# Patient Record
Sex: Male | Born: 1945 | Race: White | Hispanic: No | Marital: Married
Health system: Southern US, Community
[De-identification: ages and names within clinical notes are randomized; demographics above are authoritative.]

## PROBLEM LIST (undated history)

## (undated) DIAGNOSIS — E785 Hyperlipidemia, unspecified: Secondary | ICD-10-CM

## (undated) DIAGNOSIS — M961 Postlaminectomy syndrome, not elsewhere classified: Secondary | ICD-10-CM

## (undated) DIAGNOSIS — A4902 Methicillin resistant Staphylococcus aureus infection, unspecified site: Secondary | ICD-10-CM

## (undated) DIAGNOSIS — I10 Essential (primary) hypertension: Secondary | ICD-10-CM

## (undated) DIAGNOSIS — E119 Type 2 diabetes mellitus without complications: Secondary | ICD-10-CM

## (undated) DIAGNOSIS — G4730 Sleep apnea, unspecified: Secondary | ICD-10-CM

## (undated) HISTORY — PX: APPENDECTOMY: SHX54

## (undated) HISTORY — DX: Postlaminectomy syndrome, not elsewhere classified: M96.1

## (undated) HISTORY — DX: Hyperlipidemia, unspecified: E78.5

## (undated) HISTORY — DX: Type 2 diabetes mellitus without complications: E11.9

## (undated) HISTORY — DX: Essential (primary) hypertension: I10

## (undated) HISTORY — PX: SPINE SURGERY: SHX786

## (undated) HISTORY — PX: OTHER SURGICAL HISTORY: SHX169

## (undated) HISTORY — DX: Sleep apnea, unspecified: G47.30

## (undated) HISTORY — DX: Methicillin resistant Staphylococcus aureus infection, unspecified site: A49.02

---

## 1994-12-13 HISTORY — PX: OTHER SURGICAL HISTORY: SHX169

## 2005-02-26 ENCOUNTER — Ambulatory Visit: Payer: Self-pay | Admitting: Specialist

## 2005-03-29 ENCOUNTER — Ambulatory Visit (HOSPITAL_COMMUNITY): Admission: RE | Admit: 2005-03-29 | Discharge: 2005-03-30 | Payer: Self-pay | Admitting: Neurosurgery

## 2005-08-24 ENCOUNTER — Ambulatory Visit (HOSPITAL_COMMUNITY): Admission: RE | Admit: 2005-08-24 | Discharge: 2005-08-24 | Payer: Self-pay | Admitting: Neurosurgery

## 2005-09-07 ENCOUNTER — Ambulatory Visit (HOSPITAL_COMMUNITY): Admission: RE | Admit: 2005-09-07 | Discharge: 2005-09-07 | Payer: Self-pay | Admitting: Neurosurgery

## 2005-09-14 ENCOUNTER — Ambulatory Visit: Payer: Self-pay | Admitting: Infectious Diseases

## 2005-09-16 ENCOUNTER — Ambulatory Visit: Payer: Self-pay | Admitting: Infectious Diseases

## 2005-09-23 ENCOUNTER — Ambulatory Visit: Payer: Self-pay | Admitting: Infectious Diseases

## 2005-10-18 ENCOUNTER — Ambulatory Visit: Payer: Self-pay | Admitting: Infectious Diseases

## 2005-10-19 ENCOUNTER — Ambulatory Visit (HOSPITAL_COMMUNITY): Admission: RE | Admit: 2005-10-19 | Discharge: 2005-10-19 | Payer: Self-pay | Admitting: Infectious Diseases

## 2005-10-25 ENCOUNTER — Ambulatory Visit: Payer: Self-pay | Admitting: Infectious Diseases

## 2006-11-06 IMAGING — XA IR FLUORO GUIDE NDL PLMT / BX
1 series · 6 of 6 positions shown · non-contrast
Comparison: none

CLINICAL DATA: Diskectomy in [DATE]. MRI worrisome for diskitis at L3-4.  Transitional anatomy with a lumbarized S-1.
 L3-4 DISK ASPIRATION:
 Procedure:  The back was prepped and draped in a sterile fashion.  Lidocaine was utilized for local anesthesia.  Under fluoroscopic guidance, a single 20 gauge spinal needle was inserted into the 3-4 disk from posterolateral approach. One to 2 cc of sanguinous fluid was aspirated without complication.

[Series 1: run · 6 of 6 slices shown]
[im 1/6]
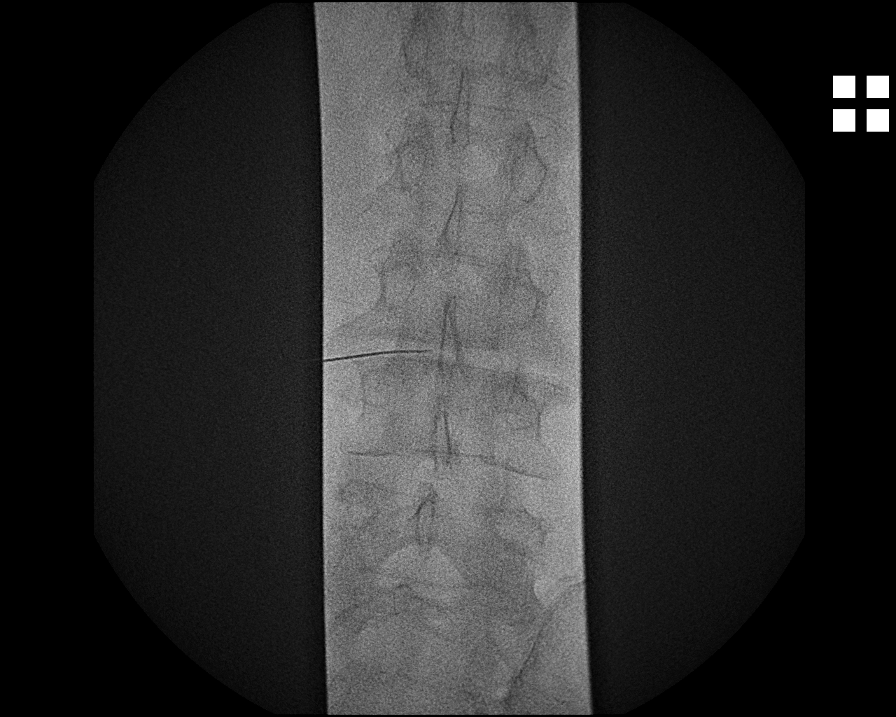
[im 2/6]
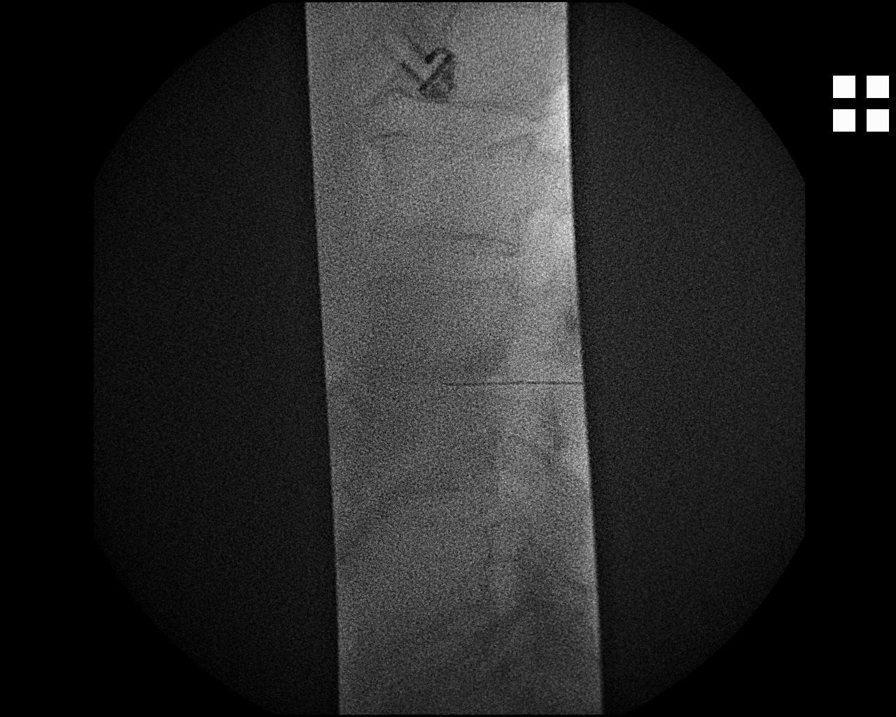
[im 3/6]
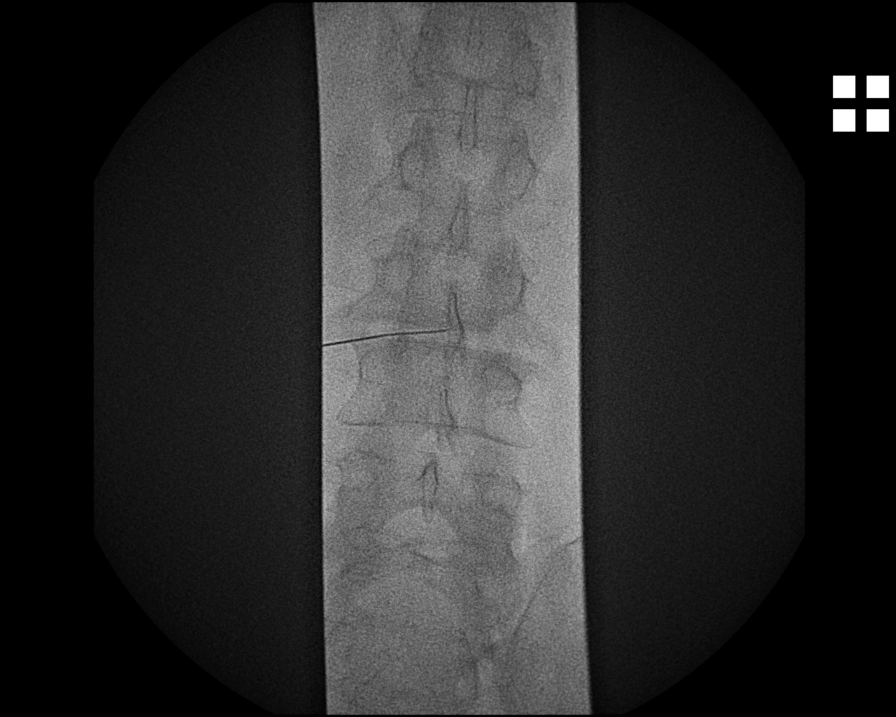
[im 4/6]
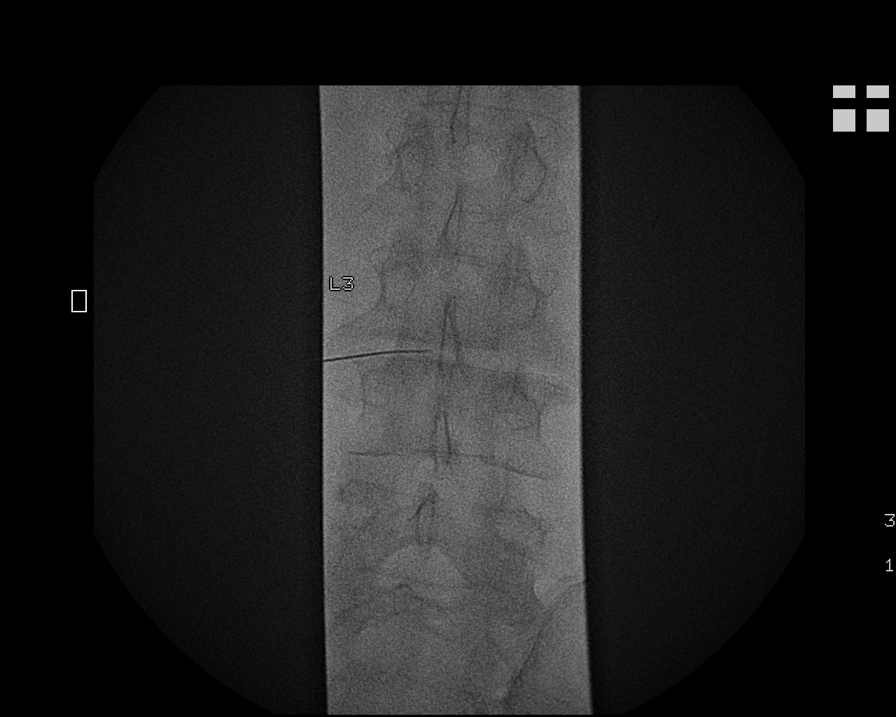
[im 5/6]
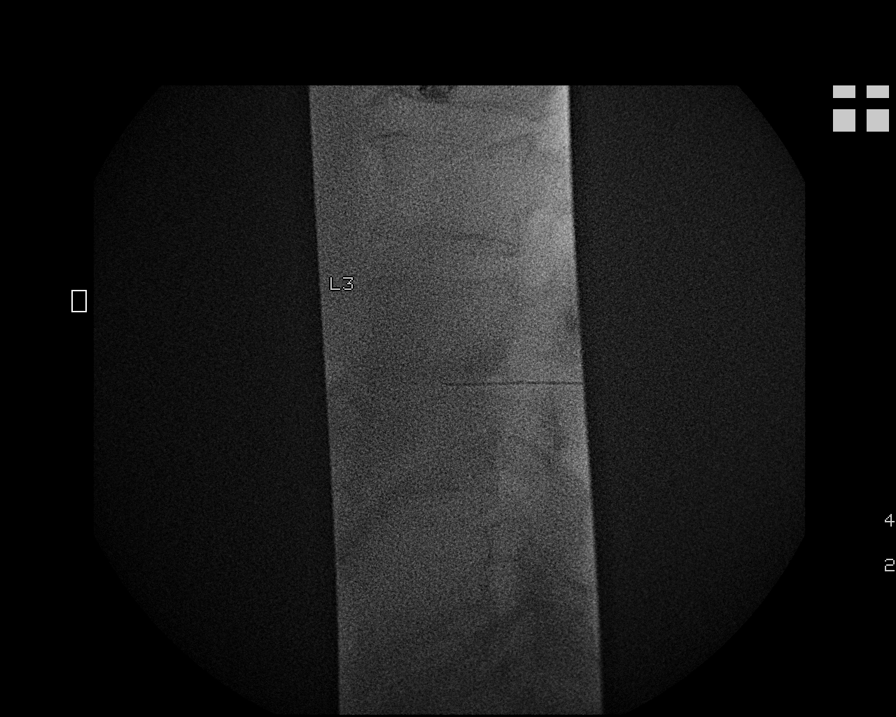
[im 6/6]
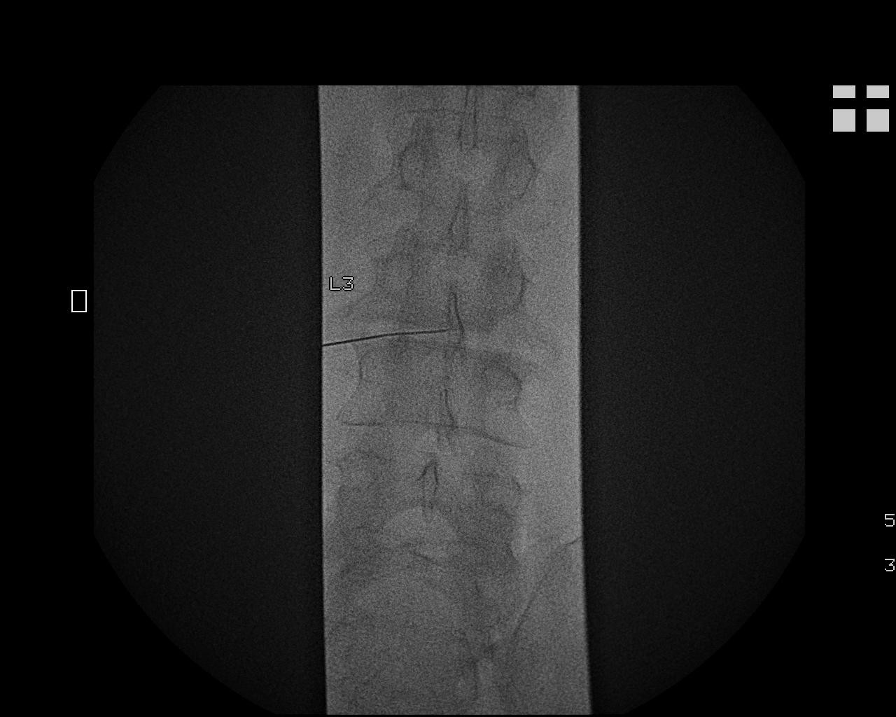

[6 of 6 positions shown; findings below may reference images not displayed]

FINDINGS: The image demonstrates needle placement in the middle of the L3-4 disk.
IMPRESSION: Successful L3-4 disk aspiration.  A sample was sent for culture sensitivity and gram stain.

## 2006-11-24 ENCOUNTER — Ambulatory Visit: Payer: Self-pay | Admitting: General Surgery

## 2008-11-28 ENCOUNTER — Ambulatory Visit: Payer: Self-pay | Admitting: Gastroenterology

## 2009-10-11 ENCOUNTER — Inpatient Hospital Stay: Payer: Self-pay | Admitting: Unknown Physician Specialty

## 2014-07-09 ENCOUNTER — Emergency Department: Payer: Self-pay | Admitting: Emergency Medicine

## 2014-07-09 LAB — CBC
HCT: 48.6 % (ref 40.0–52.0)
HGB: 16.2 g/dL (ref 13.0–18.0)
MCH: 29.4 pg (ref 26.0–34.0)
MCHC: 33.4 g/dL (ref 32.0–36.0)
MCV: 88 fL (ref 80–100)
Platelet: 177 10*3/uL (ref 150–440)
RBC: 5.52 10*6/uL (ref 4.40–5.90)
RDW: 13.3 % (ref 11.5–14.5)
WBC: 10.4 10*3/uL (ref 3.8–10.6)

## 2014-07-09 LAB — BASIC METABOLIC PANEL
ANION GAP: 12 (ref 7–16)
BUN: 17 mg/dL (ref 7–18)
CHLORIDE: 99 mmol/L (ref 98–107)
Calcium, Total: 9 mg/dL (ref 8.5–10.1)
Co2: 21 mmol/L (ref 21–32)
Creatinine: 0.96 mg/dL (ref 0.60–1.30)
EGFR (African American): >60
EGFR (Non-African Amer.): >60
Glucose: 175 mg/dL — ABNORMAL HIGH (ref 65–99)
Osmolality: 270 (ref 275–301)
Potassium: 3.7 mmol/L (ref 3.5–5.1)
Sodium: 132 mmol/L — ABNORMAL LOW (ref 136–145)

## 2014-07-09 LAB — PRO B NATRIURETIC PEPTIDE: B-Type Natriuretic Peptide: 64 pg/mL (ref 0–125)

## 2014-07-09 LAB — TROPONIN I: TROPONIN-I: 0.02 ng/mL

## 2015-06-09 ENCOUNTER — Other Ambulatory Visit: Payer: Self-pay | Admitting: Family Medicine

## 2015-06-25 ENCOUNTER — Ambulatory Visit: Payer: Self-pay | Admitting: Family Medicine

## 2015-07-29 DIAGNOSIS — I10 Essential (primary) hypertension: Secondary | ICD-10-CM | POA: Insufficient documentation

## 2015-07-29 DIAGNOSIS — E119 Type 2 diabetes mellitus without complications: Secondary | ICD-10-CM | POA: Insufficient documentation

## 2015-07-29 DIAGNOSIS — E785 Hyperlipidemia, unspecified: Secondary | ICD-10-CM | POA: Insufficient documentation

## 2015-07-30 ENCOUNTER — Telehealth: Payer: Self-pay | Admitting: Family Medicine

## 2015-07-30 ENCOUNTER — Other Ambulatory Visit: Payer: Self-pay | Admitting: Family Medicine

## 2015-07-30 ENCOUNTER — Telehealth: Payer: Self-pay

## 2015-07-30 ENCOUNTER — Encounter: Payer: Self-pay | Admitting: Family Medicine

## 2015-07-30 ENCOUNTER — Ambulatory Visit (INDEPENDENT_AMBULATORY_CARE_PROVIDER_SITE_OTHER): Payer: Federal, State, Local not specified - PPO | Admitting: Family Medicine

## 2015-07-30 VITALS — BP 136/74 | HR 78 | Temp 98.9°F | Ht 68.5 in | Wt 235.0 lb

## 2015-07-30 DIAGNOSIS — E119 Type 2 diabetes mellitus without complications: Secondary | ICD-10-CM | POA: Diagnosis not present

## 2015-07-30 DIAGNOSIS — I10 Essential (primary) hypertension: Secondary | ICD-10-CM

## 2015-07-30 DIAGNOSIS — E785 Hyperlipidemia, unspecified: Secondary | ICD-10-CM

## 2015-07-30 LAB — LP+ALT+AST PICCOLO, WAIVED
ALT (SGPT) Piccolo, Waived: 21 U/L (ref 10–47)
AST (SGOT) Piccolo, Waived: 25 U/L (ref 11–38)
Chol/HDL Ratio Piccolo,Waive: 5.9 mg/dL — ABNORMAL HIGH
Cholesterol Piccolo, Waived: 202 mg/dL — ABNORMAL HIGH (ref <200)
HDL Chol Piccolo, Waived: 34 mg/dL — ABNORMAL LOW (ref >59)
LDL CHOL CALC PICCOLO WAIVED: 114 mg/dL — AB (ref <100)
Triglycerides Piccolo,Waived: 271 mg/dL — ABNORMAL HIGH (ref <150)
VLDL Chol Calc Piccolo,Waive: 54 mg/dL — ABNORMAL HIGH (ref <30)

## 2015-07-30 LAB — MICROALBUMIN, URINE WAIVED
CREATININE, URINE WAIVED: 100 mg/dL (ref 10–300)
Microalb, Ur Waived: 150 mg/L — ABNORMAL HIGH (ref 0–19)
Microalb/Creat Ratio: >300 mg/g — ABNORMAL HIGH (ref <30)

## 2015-07-30 LAB — BAYER DCA HB A1C WAIVED: HB A1C (BAYER DCA - WAIVED): 7 % — ABNORMAL HIGH (ref <7.0)

## 2015-07-30 MED ORDER — AMLODIPINE BESYLATE 5 MG PO TABS: 5.0000 mg | ORAL_TABLET | Freq: Every day | ORAL | Status: DC

## 2015-07-30 MED ORDER — METFORMIN HCL 500 MG PO TABS: 1000.0000 mg | ORAL_TABLET | Freq: Two times a day (BID) | ORAL | Status: DC

## 2015-07-30 MED ORDER — GLIPIZIDE 5 MG PO TABS: 5.0000 mg | ORAL_TABLET | Freq: Every day | ORAL | Status: DC

## 2015-07-30 MED ORDER — BENAZEPRIL HCL 40 MG PO TABS: 40.0000 mg | ORAL_TABLET | Freq: Every day | ORAL | Status: DC

## 2015-07-30 MED ORDER — EMPAGLIFLOZIN 25 MG PO TABS: 25.0000 mg | ORAL_TABLET | Freq: Every day | ORAL | Status: DC

## 2015-07-30 MED ORDER — LIRAGLUTIDE 18 MG/3ML ~~LOC~~ SOPN: 1.8000 mg | PEN_INJECTOR | Freq: Every day | SUBCUTANEOUS | Status: DC

## 2015-07-30 MED ORDER — OXYCODONE-ACETAMINOPHEN 5-325 MG PO TABS: 1.0000 | ORAL_TABLET | Freq: Two times a day (BID) | ORAL | Status: DC | PRN

## 2015-07-30 MED ORDER — INSULIN PEN NEEDLE 32G X 6 MM MISC: 1.0000 | Freq: Every day | Status: DC

## 2015-07-30 MED ORDER — SIMVASTATIN 40 MG PO TABS: 40.0000 mg | ORAL_TABLET | Freq: Every day | ORAL | Status: DC

## 2015-07-30 MED ORDER — OXYCODONE-ACETAMINOPHEN 5-325 MG PO TABS: 1.0000 | ORAL_TABLET | Freq: Every day | ORAL | Status: DC | PRN

## 2015-07-30 NOTE — Telephone Encounter (Signed)
Percocet historically written for BID, she would like a new Rx

## 2015-07-30 NOTE — Progress Notes (Signed)
   BP 136/74 mmHg  Pulse 78  Temp(Src) 98.9 F (37.2 C)  Ht 5' 8.5" (1.74 m)  Wt 235 lb (106.595 kg)  BMI 35.21 kg/m2  SpO2 96%   Subjective:    Patient ID: Edward Preston, male    DOB: 09-11-1946, 69 y.o.   MRN: [id number]  HPI: JORDIE SCHREUR is a 69 y.o. male  No chief complaint on file.  patient recheck medical problems has been doing stable with diabetes hypertension. Has been trying to treat cholesterol with dietary supplements and diet. Not taking simvastatin. Back pain controlled with Percocet takes when necessary 180 tablets has lasted more than 180 days.  Relevant past medical, surgical, family and social history reviewed and updated as indicated. Interim medical history since our last visit reviewed. Allergies and medications reviewed and updated.  Review of Systems  Constitutional: Negative.   Respiratory: Negative.   Cardiovascular: Negative.   Endocrine: Negative.     Per HPI unless specifically indicated above     Objective:    BP 136/74 mmHg  Pulse 78  Temp(Src) 98.9 F (37.2 C)  Ht 5' 8.5" (1.74 m)  Wt 235 lb (106.595 kg)  BMI 35.21 kg/m2  SpO2 96%  Wt Readings from Last 3 Encounters:  07/30/15 235 lb (106.595 kg)  03/13/15 235 lb (106.595 kg)    Physical Exam  Constitutional: He is oriented to person, place, and time. He appears well-developed and well-nourished. No distress.  HENT:  Head: Normocephalic and atraumatic.  Right Ear: Hearing normal.  Left Ear: Hearing normal.  Nose: Nose normal.  Eyes: Conjunctivae and lids are normal. Right eye exhibits no discharge. Left eye exhibits no discharge. No scleral icterus.  Cardiovascular: Normal rate, regular rhythm and normal heart sounds.   Pulmonary/Chest: Effort normal and breath sounds normal. No respiratory distress.  Musculoskeletal: Normal range of motion.  Neurological: He is alert and oriented to person, place, and time.  Skin: Skin is intact. No rash noted.  Psychiatric: He has a  normal mood and affect. His speech is normal and behavior is normal. Judgment and thought content normal. Cognition and memory are normal.        Assessment & Plan:   Problem List Items Addressed This Visit      Endocrine   Diabetes mellitus without complication   Relevant Medications   simvastatin (ZOCOR) 40 MG tablet   metFORMIN (GLUCOPHAGE) 500 MG tablet   glipiZIDE (GLUCOTROL) 5 MG tablet   empagliflozin (JARDIANCE) 25 MG TABS tablet   benazepril (LOTENSIN) 40 MG tablet   Liraglutide (VICTOZA) 18 MG/3ML SOPN   Other Relevant Orders   Bayer DCA Hb A1c Waived   Microalbumin, Urine Waived    Other Visit Diagnoses    Hyperlipemia    -  Primary    Relevant Medications    simvastatin (ZOCOR) 40 MG tablet    benazepril (LOTENSIN) 40 MG tablet    amLODipine (NORVASC) 5 MG tablet    Other Relevant Orders    LP+ALT+AST Piccolo, Waived    Essential hypertension, benign        Relevant Medications    simvastatin (ZOCOR) 40 MG tablet    benazepril (LOTENSIN) 40 MG tablet    amLODipine (NORVASC) 5 MG tablet    Other Relevant Orders    Basic metabolic panel        Follow up plan: Return in about 3 months (around 10/30/2015) for Diabetes recheck.

## 2015-07-30 NOTE — Telephone Encounter (Signed)
PTS WIFE WENT [place] AND THEY TOLD HER THERE WAS SOMETHING WRONG WITH THE WAY THE RX WAS WROTE AND SHE WOULD LIKE TO SPEAK TO SOMEONE ABOUT IT TO GET THE ENTIRE RX FILLED.

## 2015-07-31 ENCOUNTER — Encounter: Payer: Self-pay | Admitting: Family Medicine

## 2015-07-31 LAB — BASIC METABOLIC PANEL
BUN/Creatinine Ratio: 11 (ref 10–22)
BUN: 11 mg/dL (ref 8–27)
CALCIUM: 9.2 mg/dL (ref 8.6–10.2)
CO2: 21 mmol/L (ref 18–29)
Chloride: 99 mmol/L (ref 97–108)
Creatinine, Ser: 0.96 mg/dL (ref 0.76–1.27)
GFR calc Af Amer: 93 mL/min/{1.73_m2} (ref >59)
GFR calc non Af Amer: 80 mL/min/{1.73_m2} (ref >59)
Glucose: 152 mg/dL — ABNORMAL HIGH (ref 65–99)
POTASSIUM: 4.5 mmol/L (ref 3.5–5.2)
SODIUM: 138 mmol/L (ref 134–144)

## 2015-11-04 ENCOUNTER — Ambulatory Visit: Payer: Federal, State, Local not specified - PPO | Admitting: Family Medicine

## 2015-12-16 ENCOUNTER — Ambulatory Visit (INDEPENDENT_AMBULATORY_CARE_PROVIDER_SITE_OTHER): Payer: Federal, State, Local not specified - PPO | Admitting: Family Medicine

## 2015-12-16 ENCOUNTER — Encounter: Payer: Self-pay | Admitting: Family Medicine

## 2015-12-16 VITALS — BP 137/85 | HR 79 | Temp 98.0°F | Ht 67.5 in | Wt 234.0 lb

## 2015-12-16 DIAGNOSIS — E119 Type 2 diabetes mellitus without complications: Secondary | ICD-10-CM

## 2015-12-16 DIAGNOSIS — N400 Enlarged prostate without lower urinary tract symptoms: Secondary | ICD-10-CM | POA: Diagnosis not present

## 2015-12-16 DIAGNOSIS — M539 Dorsopathy, unspecified: Secondary | ICD-10-CM | POA: Diagnosis not present

## 2015-12-16 DIAGNOSIS — I10 Essential (primary) hypertension: Secondary | ICD-10-CM

## 2015-12-16 DIAGNOSIS — M961 Postlaminectomy syndrome, not elsewhere classified: Secondary | ICD-10-CM | POA: Insufficient documentation

## 2015-12-16 DIAGNOSIS — E785 Hyperlipidemia, unspecified: Secondary | ICD-10-CM | POA: Diagnosis not present

## 2015-12-16 DIAGNOSIS — Z0000 Encounter for general adult medical examination without abnormal findings: Secondary | ICD-10-CM | POA: Diagnosis not present

## 2015-12-16 LAB — URINALYSIS, ROUTINE W REFLEX MICROSCOPIC
BILIRUBIN UA: NEGATIVE
Leukocytes, UA: NEGATIVE
NITRITE UA: NEGATIVE
Specific Gravity, UA: 1.02 (ref 1.005–1.030)
UUROB: 0.2 mg/dL (ref 0.2–1.0)
pH, UA: 5.5 (ref 5.0–7.5)

## 2015-12-16 LAB — MICROSCOPIC EXAMINATION: WBC UA: NONE SEEN /HPF (ref 0–?)

## 2015-12-16 LAB — BAYER DCA HB A1C WAIVED: HB A1C (BAYER DCA - WAIVED): 7.7 % — ABNORMAL HIGH (ref <7.0)

## 2015-12-16 MED ORDER — SIMVASTATIN 40 MG PO TABS: 40.0000 mg | ORAL_TABLET | Freq: Every day | ORAL | Status: DC

## 2015-12-16 MED ORDER — METFORMIN HCL 500 MG PO TABS: 1000.0000 mg | ORAL_TABLET | Freq: Two times a day (BID) | ORAL | Status: DC

## 2015-12-16 MED ORDER — GLIPIZIDE 5 MG PO TABS: 5.0000 mg | ORAL_TABLET | Freq: Every day | ORAL | Status: DC

## 2015-12-16 MED ORDER — AMLODIPINE BESYLATE 5 MG PO TABS: 5.0000 mg | ORAL_TABLET | Freq: Every day | ORAL | Status: DC

## 2015-12-16 MED ORDER — EMPAGLIFLOZIN 25 MG PO TABS: 25.0000 mg | ORAL_TABLET | Freq: Every day | ORAL | Status: DC

## 2015-12-16 MED ORDER — BENAZEPRIL HCL 40 MG PO TABS: 40.0000 mg | ORAL_TABLET | Freq: Every day | ORAL | Status: DC

## 2015-12-16 MED ORDER — LIRAGLUTIDE 18 MG/3ML ~~LOC~~ SOPN: 1.8000 mg | PEN_INJECTOR | Freq: Every day | SUBCUTANEOUS | Status: DC

## 2015-12-16 MED ORDER — OXYCODONE-ACETAMINOPHEN 5-325 MG PO TABS: 1.0000 | ORAL_TABLET | Freq: Two times a day (BID) | ORAL | Status: DC | PRN

## 2015-12-16 NOTE — Assessment & Plan Note (Signed)
The current medical regimen is effective;  continue present plan and medications.  

## 2015-12-16 NOTE — Progress Notes (Signed)
BP 137/85 mmHg  Pulse 79  Temp(Src) 98 F (36.7 C)  Ht 5' 7.5" (1.715 m)  Wt 234 lb (106.142 kg)  BMI 36.09 kg/m2  SpO2 99%   Subjective:    Patient ID: Edward DodrillArthur L Preston, male    DOB: 1946/07/19, 70 y.o.   MRN: [id number]  HPI: Edward Dodrillrthur L Scrivener is a 70 y.o. male  Chief Complaint  Patient presents with  . Annual Exam  . Diabetes  . Td due   Patient follow-up medical problems doing well with diabetes noted low blood sugar spells aching medications with apparent good control Blood pressure doing well with no side effects from medications Same with cholesterol medications and taking medications faithfully without problems Patient's chronic pain of his hip and lower back is been worse with a cold wet damp weather goes into his legs also. Patient has been taking his oxycodone almost every day during this cold damp weather Prescription has lasted since August has about a dozen or so left. Obviously does not take every day.  Relevant past medical, surgical, family and social history reviewed and updated as indicated. Interim medical history since our last visit reviewed. Allergies and medications reviewed and updated.  Review of Systems  Constitutional: Negative.   HENT: Negative.   Eyes: Negative.   Respiratory: Negative.   Cardiovascular: Negative.   Gastrointestinal: Negative.   Endocrine: Negative.   Genitourinary: Negative.   Musculoskeletal: Negative.   Skin: Negative.   Allergic/Immunologic: Negative.   Neurological: Negative.   Hematological: Negative.   Psychiatric/Behavioral: Negative.     Per HPI unless specifically indicated above     Objective:    BP 137/85 mmHg  Pulse 79  Temp(Src) 98 F (36.7 C)  Ht 5' 7.5" (1.715 m)  Wt 234 lb (106.142 kg)  BMI 36.09 kg/m2  SpO2 99%  Wt Readings from Last 3 Encounters:  12/16/15 234 lb (106.142 kg)  07/30/15 235 lb (106.595 kg)  03/13/15 235 lb (106.595 kg)    Physical Exam  Constitutional: He is oriented  to person, place, and time. He appears well-developed and well-nourished.  HENT:  Head: Normocephalic and atraumatic.  Right Ear: External ear normal.  Left Ear: External ear normal.  Eyes: Conjunctivae and EOM are normal. Pupils are equal, round, and reactive to light.  Neck: Normal range of motion. Neck supple.  Cardiovascular: Normal rate, regular rhythm, normal heart sounds and intact distal pulses.   Pulmonary/Chest: Effort normal and breath sounds normal.  Abdominal: Soft. Bowel sounds are normal. There is no splenomegaly or hepatomegaly.  Genitourinary: Rectum normal and penis normal.  Prostate enlarged  Musculoskeletal: Normal range of motion.  Neurological: He is alert and oriented to person, place, and time. He has normal reflexes.  Skin: No rash noted. No erythema.  Psychiatric: He has a normal mood and affect. His behavior is normal. Judgment and thought content normal.    Results for orders placed or performed in visit on 07/30/15  Bayer DCA Hb A1c Waived  Result Value Ref Range   Bayer DCA Hb A1c Waived 7.0 (H) <7.0 %  LP+ALT+AST Piccolo, Waived  Result Value Ref Range   ALT (SGPT) Piccolo, Waived 21 10 - 47 U/L   AST (SGOT) Piccolo, Waived 25 11 - 38 U/L   Cholesterol Piccolo, Waived 202 (H) <200 mg/dL   HDL Chol Piccolo, Waived 34 (L) >59 mg/dL   Triglycerides Piccolo,Waived 271 (H) <150 mg/dL   Chol/HDL Ratio Piccolo,Waive 5.9 (H) mg/dL   LDL Chol  Calc Piccolo Waived 114 (H) <100 mg/dL   VLDL Chol Calc Piccolo,Waive 54 (H) <30 mg/dL  Basic metabolic panel  Result Value Ref Range   Glucose 152 (H) 65 - 99 mg/dL   BUN 11 8 - 27 mg/dL   Creatinine, Ser 1.61 0.76 - 1.27 mg/dL   GFR calc non Af Amer 80 >59 mL/min/1.73   GFR calc Af Amer 93 >59 mL/min/1.73   BUN/Creatinine Ratio 11 10 - 22   Sodium 138 134 - 144 mmol/L   Potassium 4.5 3.5 - 5.2 mmol/L   Chloride 99 97 - 108 mmol/L   CO2 21 18 - 29 mmol/L   Calcium 9.2 8.6 - 10.2 mg/dL  Microalbumin, Urine  Waived  Result Value Ref Range   Microalb, Ur Waived 150 (H) 0 - 19 mg/L   Creatinine, Urine Waived 100 10 - 300 mg/dL   Microalb/Creat Ratio >300 (H) <30 mg/g      Assessment & Plan:   Problem List Items Addressed This Visit      Cardiovascular and Mediastinum   Hypertension - Primary    The current medical regimen is effective;  continue present plan and medications.       Relevant Medications   amLODipine (NORVASC) 5 MG tablet   benazepril (LOTENSIN) 40 MG tablet   simvastatin (ZOCOR) 40 MG tablet     Endocrine   Diabetes mellitus without complication (HCC)    The current medical regimen is effective;  continue present plan and medications.       Relevant Medications   benazepril (LOTENSIN) 40 MG tablet   empagliflozin (JARDIANCE) 25 MG TABS tablet   glipiZIDE (GLUCOTROL) 5 MG tablet   metFORMIN (GLUCOPHAGE) 500 MG tablet   simvastatin (ZOCOR) 40 MG tablet   Liraglutide (VICTOZA) 18 MG/3ML SOPN   Other Relevant Orders   Bayer DCA Hb A1c Waived   Comprehensive metabolic panel   CBC with Differential/Platelet   PSA   Urinalysis, Routine w reflex microscopic (not at [facility])   TSH     Genitourinary   BPH (benign prostatic hyperplasia)     Other   Hyperlipidemia    The current medical regimen is effective;  continue present plan and medications.       Relevant Medications   amLODipine (NORVASC) 5 MG tablet   benazepril (LOTENSIN) 40 MG tablet   simvastatin (ZOCOR) 40 MG tablet   Other Relevant Orders   Comprehensive metabolic panel   CBC with Differential/Platelet   PSA   Urinalysis, Routine w reflex microscopic (not at [facility])   TSH   Failed back syndrome    Pain management has been adequate discussed use of narcotics and minimal use. discussed the patient needs more medication will need to go through the pain clinic.      Relevant Medications   oxyCODONE-acetaminophen (PERCOCET/ROXICET) 5-325 MG tablet    Other Visit Diagnoses    Essential  hypertension, benign        Relevant Medications    amLODipine (NORVASC) 5 MG tablet    benazepril (LOTENSIN) 40 MG tablet    simvastatin (ZOCOR) 40 MG tablet    Other Relevant Orders    Comprehensive metabolic panel    Lipid panel    CBC with Differential/Platelet    PSA    Urinalysis, Routine w reflex microscopic (not at [facility])    TSH    Hyperlipemia        Relevant Medications    amLODipine (NORVASC) 5 MG tablet  benazepril (LOTENSIN) 40 MG tablet    simvastatin (ZOCOR) 40 MG tablet    PE (physical exam), annual            Follow up plan: Return in about 3 months (around 03/15/2016) for a1c.

## 2015-12-16 NOTE — Assessment & Plan Note (Signed)
Pain management has been adequate discussed use of narcotics and minimal use. discussed the patient needs more medication will need to go through the pain clinic.

## 2015-12-17 ENCOUNTER — Encounter: Payer: Self-pay | Admitting: Family Medicine

## 2015-12-17 LAB — COMPREHENSIVE METABOLIC PANEL
A/G RATIO: 2.1 (ref 1.1–2.5)
ALK PHOS: 67 IU/L (ref 39–117)
ALT: 20 IU/L (ref 0–44)
AST: 16 IU/L (ref 0–40)
Albumin: 4.6 g/dL (ref 3.6–4.8)
BILIRUBIN TOTAL: 0.5 mg/dL (ref 0.0–1.2)
BUN/Creatinine Ratio: 16 (ref 10–22)
BUN: 14 mg/dL (ref 8–27)
CHLORIDE: 97 mmol/L (ref 96–106)
CO2: 23 mmol/L (ref 18–29)
Calcium: 9.5 mg/dL (ref 8.6–10.2)
Creatinine, Ser: 0.87 mg/dL (ref 0.76–1.27)
GFR calc Af Amer: 102 mL/min/{1.73_m2} (ref >59)
GFR calc non Af Amer: 88 mL/min/{1.73_m2} (ref >59)
GLOBULIN, TOTAL: 2.2 g/dL (ref 1.5–4.5)
Glucose: 114 mg/dL — ABNORMAL HIGH (ref 65–99)
POTASSIUM: 4.4 mmol/L (ref 3.5–5.2)
SODIUM: 139 mmol/L (ref 134–144)
Total Protein: 6.8 g/dL (ref 6.0–8.5)

## 2015-12-17 LAB — CBC WITH DIFFERENTIAL/PLATELET
BASOS ABS: 0 10*3/uL (ref 0.0–0.2)
BASOS: 0 %
EOS (ABSOLUTE): 0.2 10*3/uL (ref 0.0–0.4)
Eos: 2 %
HEMOGLOBIN: 14.6 g/dL (ref 12.6–17.7)
Hematocrit: 44 % (ref 37.5–51.0)
IMMATURE GRANS (ABS): 0 10*3/uL (ref 0.0–0.1)
Immature Granulocytes: 0 %
LYMPHS: 29 %
Lymphocytes Absolute: 2.1 10*3/uL (ref 0.7–3.1)
MCH: 29.4 pg (ref 26.6–33.0)
MCHC: 33.2 g/dL (ref 31.5–35.7)
MCV: 89 fL (ref 79–97)
MONOCYTES: 10 %
Monocytes Absolute: 0.7 10*3/uL (ref 0.1–0.9)
NEUTROS ABS: 4.1 10*3/uL (ref 1.4–7.0)
Neutrophils: 59 %
Platelets: 138 10*3/uL — ABNORMAL LOW (ref 150–379)
RBC: 4.96 x10E6/uL (ref 4.14–5.80)
RDW: 13 % (ref 12.3–15.4)
WBC: 7.1 10*3/uL (ref 3.4–10.8)

## 2015-12-17 LAB — LIPID PANEL
CHOL/HDL RATIO: 4.4 ratio (ref 0.0–5.0)
CHOLESTEROL TOTAL: 162 mg/dL (ref 100–199)
HDL: 37 mg/dL — ABNORMAL LOW (ref >39)
LDL Calculated: 90 mg/dL (ref 0–99)
TRIGLYCERIDES: 173 mg/dL — AB (ref 0–149)
VLDL Cholesterol Cal: 35 mg/dL (ref 5–40)

## 2015-12-17 LAB — PSA: PROSTATE SPECIFIC AG, SERUM: 0.8 ng/mL (ref 0.0–4.0)

## 2015-12-17 LAB — TSH: TSH: 3.62 u[IU]/mL (ref 0.450–4.500)

## 2016-01-12 ENCOUNTER — Telehealth: Payer: Self-pay | Admitting: Family Medicine

## 2016-01-12 NOTE — Telephone Encounter (Signed)
empagliflozin (JARDIANCE) 25 MG TABS tablet Patient wife called stating that patient needs prior authorization for his medication. They gave her this phone number to call [phone] op. # [phone] then #0, thanks. Please call patient back when it is done.

## 2016-01-28 ENCOUNTER — Encounter: Payer: Self-pay | Admitting: Family Medicine

## 2016-01-31 ENCOUNTER — Other Ambulatory Visit: Payer: Self-pay | Admitting: Family Medicine

## 2016-02-11 ENCOUNTER — Telehealth: Payer: Self-pay

## 2016-02-11 NOTE — Telephone Encounter (Signed)
Contacted patient to respond to concerns regarding copay charge for the same day as Edward Preston physical.  Explained details of exam as instructed by Dr. Christell Faith response to my [employer]

## 2016-03-17 ENCOUNTER — Ambulatory Visit: Payer: Federal, State, Local not specified - PPO | Admitting: Family Medicine

## 2016-05-06 ENCOUNTER — Ambulatory Visit (INDEPENDENT_AMBULATORY_CARE_PROVIDER_SITE_OTHER): Payer: Federal, State, Local not specified - PPO | Admitting: Family Medicine

## 2016-05-06 ENCOUNTER — Encounter: Payer: Self-pay | Admitting: Family Medicine

## 2016-05-06 VITALS — BP 127/76 | HR 75 | Temp 98.1°F | Ht 67.5 in | Wt 232.0 lb

## 2016-05-06 DIAGNOSIS — E119 Type 2 diabetes mellitus without complications: Secondary | ICD-10-CM

## 2016-05-06 DIAGNOSIS — M961 Postlaminectomy syndrome, not elsewhere classified: Secondary | ICD-10-CM

## 2016-05-06 DIAGNOSIS — I10 Essential (primary) hypertension: Secondary | ICD-10-CM

## 2016-05-06 DIAGNOSIS — M539 Dorsopathy, unspecified: Secondary | ICD-10-CM

## 2016-05-06 LAB — BAYER DCA HB A1C WAIVED: HB A1C (BAYER DCA - WAIVED): 8 % — ABNORMAL HIGH (ref <7.0)

## 2016-05-06 MED ORDER — SITAGLIPTIN PHOSPHATE 100 MG PO TABS: 100.0000 mg | ORAL_TABLET | Freq: Every day | ORAL | Status: DC

## 2016-05-06 MED ORDER — OXYCODONE-ACETAMINOPHEN 5-325 MG PO TABS: 1.0000 | ORAL_TABLET | Freq: Two times a day (BID) | ORAL | Status: DC | PRN

## 2016-05-06 NOTE — Assessment & Plan Note (Signed)
Discussed diabetes poor control importance of lifestyle risk factors weight loss. We'll start Januvia. If blood sugar not down we will need to strongly consider insulin.

## 2016-05-06 NOTE — Progress Notes (Addendum)
BP 127/76 mmHg  Pulse 75  Temp(Src) 98.1 F (36.7 C)  Ht 5' 7.5" (1.715 m)  Wt 232 lb (105.235 kg)  BMI 35.78 kg/m2  SpO2 99%   Subjective:    Patient ID: Edward Preston Paulo, male    DOB: 06-07-1946, 70 y.o.   MRN: [id number]  HPI: Edward Dodrillrthur Preston Carie is a 70 y.o. male  Chief Complaint  Patient presents with  . Diabetes  . Hypertension   Patient recheck diabetes poor control with blood sugar being elevated has not really been working on diet exercise nutrition. No low blood sugar spells no issues with medications which he has been taken faithfully. Blood pressure doing well cholesterol doing well Low back pain intermittent patient's about to run out of his 180 Percocet that was given this winter. Patient doesn't take medication every day does takes occasionally back pains worse with prolonged walking or activities  Relevant past medical, surgical, family and social history reviewed and updated as indicated. Interim medical history since our last visit reviewed. Allergies and medications reviewed and updated.  Review of Systems  Constitutional: Negative.   Respiratory: Negative.   Cardiovascular: Negative.     Per HPI unless specifically indicated above     Objective:    BP 127/76 mmHg  Pulse 75  Temp(Src) 98.1 F (36.7 C)  Ht 5' 7.5" (1.715 m)  Wt 232 lb (105.235 kg)  BMI 35.78 kg/m2  SpO2 99%  Wt Readings from Last 3 Encounters:  05/06/16 232 lb (105.235 kg)  12/16/15 234 lb (106.142 kg)  07/30/15 235 lb (106.595 kg)    Physical Exam  Constitutional: He is oriented to person, place, and time. He appears well-developed and well-nourished. No distress.  HENT:  Head: Normocephalic and atraumatic.  Right Ear: Hearing normal.  Left Ear: Hearing normal.  Nose: Nose normal.  Eyes: Conjunctivae and lids are normal. Right eye exhibits no discharge. Left eye exhibits no discharge. No scleral icterus.  Cardiovascular: Normal rate, regular rhythm and normal heart  sounds.   Pulmonary/Chest: Effort normal and breath sounds normal. No respiratory distress.  Musculoskeletal: Normal range of motion.  Neurological: He is alert and oriented to person, place, and time.  Skin: Skin is intact. No rash noted.  Psychiatric: He has a normal mood and affect. His speech is normal and behavior is normal. Judgment and thought content normal. Cognition and memory are normal.    Results for orders placed or performed in visit on 12/16/15  Microscopic Examination  Result Value Ref Range   WBC, UA None seen 0 -  5 /hpf   RBC, UA 3-10 (A) 0 -  2 /hpf   Epithelial Cells (non renal) 0-10 0 - 10 /hpf  Bayer DCA Hb A1c Waived  Result Value Ref Range   Bayer DCA Hb A1c Waived 7.7 (H) <7.0 %  Comprehensive metabolic panel  Result Value Ref Range   Glucose 114 (H) 65 - 99 mg/dL   BUN 14 8 - 27 mg/dL   Creatinine, Ser 4.090.87 0.76 - 1.27 mg/dL   GFR calc non Af Amer 88 >59 mL/min/1.73   GFR calc Af Amer 102 >59 mL/min/1.73   BUN/Creatinine Ratio 16 10 - 22   Sodium 139 134 - 144 mmol/Preston   Potassium 4.4 3.5 - 5.2 mmol/Preston   Chloride 97 96 - 106 mmol/Preston   CO2 23 18 - 29 mmol/Preston   Calcium 9.5 8.6 - 10.2 mg/dL   Total Protein 6.8 6.0 - 8.5 g/dL  Albumin 4.6 3.6 - 4.8 g/dL   Globulin, Total 2.2 1.5 - 4.5 g/dL   Albumin/Globulin Ratio 2.1 1.1 - 2.5   Bilirubin Total 0.5 0.0 - 1.2 mg/dL   Alkaline Phosphatase 67 39 - 117 IU/Preston   AST 16 0 - 40 IU/Preston   ALT 20 0 - 44 IU/Preston  Lipid panel  Result Value Ref Range   Cholesterol, Total 162 100 - 199 mg/dL   Triglycerides 696 (H) 0 - 149 mg/dL   HDL 37 (Preston) >29 mg/dL   VLDL Cholesterol Cal 35 5 - 40 mg/dL   LDL Calculated 90 0 - 99 mg/dL   Chol/HDL Ratio 4.4 0.0 - 5.0 ratio units  CBC with Differential/Platelet  Result Value Ref Range   WBC 7.1 3.4 - 10.8 x10E3/uL   RBC 4.96 4.14 - 5.80 x10E6/uL   Hemoglobin 14.6 12.6 - 17.7 g/dL   Hematocrit 52.8 41.3 - 51.0 %   MCV 89 79 - 97 fL   MCH 29.4 26.6 - 33.0 pg   MCHC 33.2 31.5 -  35.7 g/dL   RDW 24.4 01.0 - 27.2 %   Platelets 138 (Preston) 150 - 379 x10E3/uL   Neutrophils 59 %   Lymphs 29 %   Monocytes 10 %   Eos 2 %   Basos 0 %   Neutrophils Absolute 4.1 1.4 - 7.0 x10E3/uL   Lymphocytes Absolute 2.1 0.7 - 3.1 x10E3/uL   Monocytes Absolute 0.7 0.1 - 0.9 x10E3/uL   EOS (ABSOLUTE) 0.2 0.0 - 0.4 x10E3/uL   Basophils Absolute 0.0 0.0 - 0.2 x10E3/uL   Immature Granulocytes 0 %   Immature Grans (Abs) 0.0 0.0 - 0.1 x10E3/uL  PSA  Result Value Ref Range   Prostate Specific Ag, Serum 0.8 0.0 - 4.0 ng/mL  Urinalysis, Routine w reflex microscopic (not at [facility])  Result Value Ref Range   Specific Gravity, UA 1.020 1.005 - 1.030   pH, UA 5.5 5.0 - 7.5   Color, UA Yellow Yellow   Appearance Ur Clear Clear   Leukocytes, UA Negative Negative   Protein, UA 1+ (A) Negative/Trace   Glucose, UA 3+ (A) Negative   Ketones, UA 1+ (A) Negative   RBC, UA Trace (A) Negative   Bilirubin, UA Negative Negative   Urobilinogen, Ur 0.2 0.2 - 1.0 mg/dL   Nitrite, UA Negative Negative   Microscopic Examination See below:   TSH  Result Value Ref Range   TSH 3.620 0.450 - 4.500 uIU/mL      Assessment & Plan:   Problem List Items Addressed This Visit      Cardiovascular and Mediastinum   Hypertension    The current medical regimen is effective;  continue present plan and medications.         Endocrine   Diabetes mellitus without complication (HCC) - Primary    Discussed diabetes poor control importance of lifestyle risk factors weight loss. We'll start Januvia. If blood sugar not down we will need to strongly consider insulin.      Relevant Medications   sitaGLIPtin (JANUVIA) 100 MG tablet   Other Relevant Orders   Bayer DCA Hb A1c Waived     Other   Failed back syndrome    Refill Percocet discussed pain clinic referral if escalation in pain control needed      Relevant Medications   oxyCODONE-acetaminophen (PERCOCET/ROXICET) 5-325 MG tablet       Follow up  plan: Return in about 3 months (around 08/06/2016) for A1c.

## 2016-05-06 NOTE — Addendum Note (Signed)
Addended byVonita Moss: Shaila Gilchrest on: 05/06/2016 10:29 AM   Modules accepted: K[employer] Brood[employer]Set

## 2016-05-06 NOTE — Assessment & Plan Note (Signed)
The current medical regimen is effective;  continue present plan and medications.  

## 2016-05-06 NOTE — Assessment & Plan Note (Signed)
Refill Percocet discussed pain clinic referral if escalation in pain control needed

## 2016-05-31 ENCOUNTER — Telehealth: Payer: Self-pay | Admitting: Family Medicine

## 2016-05-31 ENCOUNTER — Other Ambulatory Visit: Payer: Self-pay | Admitting: Family Medicine

## 2016-05-31 MED ORDER — SITAGLIPTIN PHOSPHATE 100 MG PO TABS: 100.0000 mg | ORAL_TABLET | Freq: Every day | ORAL | Status: DC

## 2016-05-31 NOTE — Telephone Encounter (Signed)
Pt called stated they need a new RX for Januvia sent to [facility]. Has to be for a 90 day supply. Thanks.

## 2016-06-07 ENCOUNTER — Telehealth: Payer: Self-pay

## 2016-06-07 ENCOUNTER — Encounter: Payer: Self-pay | Admitting: Family Medicine

## 2016-06-07 NOTE — Telephone Encounter (Signed)
They require a letter can you please write one.

## 2016-06-07 NOTE — Telephone Encounter (Signed)
Jury duty paper was dropped off by patients wife, they would like this filled out. Does he qualify to be excused from this?

## 2016-06-07 NOTE — Telephone Encounter (Signed)
All set!

## 2016-06-07 NOTE — Telephone Encounter (Signed)
Yeah, I can fill that out for failed back - inability to sit for long periods of time

## 2016-07-22 ENCOUNTER — Other Ambulatory Visit: Payer: Self-pay | Admitting: Family Medicine

## 2016-08-18 ENCOUNTER — Ambulatory Visit: Payer: Federal, State, Local not specified - PPO | Admitting: Family Medicine

## 2016-08-19 ENCOUNTER — Ambulatory Visit (INDEPENDENT_AMBULATORY_CARE_PROVIDER_SITE_OTHER): Payer: Federal, State, Local not specified - PPO | Admitting: Family Medicine

## 2016-08-19 ENCOUNTER — Encounter: Payer: Self-pay | Admitting: Family Medicine

## 2016-08-19 VITALS — BP 131/81 | HR 80 | Temp 97.5°F | Ht 68.2 in | Wt 238.8 lb

## 2016-08-19 DIAGNOSIS — I10 Essential (primary) hypertension: Secondary | ICD-10-CM

## 2016-08-19 DIAGNOSIS — M961 Postlaminectomy syndrome, not elsewhere classified: Secondary | ICD-10-CM

## 2016-08-19 DIAGNOSIS — E785 Hyperlipidemia, unspecified: Secondary | ICD-10-CM | POA: Diagnosis not present

## 2016-08-19 DIAGNOSIS — Z23 Encounter for immunization: Secondary | ICD-10-CM

## 2016-08-19 DIAGNOSIS — M539 Dorsopathy, unspecified: Secondary | ICD-10-CM | POA: Diagnosis not present

## 2016-08-19 DIAGNOSIS — E119 Type 2 diabetes mellitus without complications: Secondary | ICD-10-CM | POA: Diagnosis not present

## 2016-08-19 LAB — BAYER DCA HB A1C WAIVED: HB A1C (BAYER DCA - WAIVED): 7.9 % — ABNORMAL HIGH (ref <7.0)

## 2016-08-19 NOTE — Assessment & Plan Note (Signed)
The current medical regimen is effective;  continue present plan and medications.  

## 2016-08-19 NOTE — Assessment & Plan Note (Signed)
Discussed pain management and use of controlled drugs. I discussed that due to the current political climate I would be unable to prescribe further narcotic prescriptions. Discuss referral to the pain clinic. Patient deferring pain clinic referral stating his pain is not that bad only intermittent use of narcotics. Patient will use Tylenol instead.

## 2016-08-19 NOTE — Assessment & Plan Note (Signed)
Discussed need for better control diet exercise nutrition patient will do better

## 2016-08-19 NOTE — Progress Notes (Signed)
BP 131/81 (BP Location: Left Arm, Patient Position: Sitting, Cuff Size: Large)   Pulse 80   Temp 97.5 F (36.4 C)   Ht 5' 8.2" (1.732 m)   Wt 238 lb 12.8 oz (108.3 kg)   SpO2 97%   BMI 36.10 kg/m    Subjective:    Patient ID: Edward Preston, male    DOB: 1946-06-14, 70 y.o.   MRN: [id number]  HPI: Edward Preston is a 70 y.o. male  Chief Complaint  Patient presents with  . Diabetes  . Hyperlipidemia  . Hypertension  Follow-up medical problems all in all doing well with no diabetes issues low blood sugar spells has been unsuccessful in losing weight but has gained weight and taking medications without problems. Blood pressure good control taking faithfully Cholesterol good control taking faithfully Pain medication use of oxycodone has been intermittent and sporadic 4 occasional times when having a lot of pain.  Relevant past medical, surgical, family and social history reviewed and updated as indicated. Interim medical history since our last visit reviewed. Allergies and medications reviewed and updated.  Review of Systems  Constitutional: Negative.   Respiratory: Negative.   Cardiovascular: Negative.     Per HPI unless specifically indicated above     Objective:    BP 131/81 (BP Location: Left Arm, Patient Position: Sitting, Cuff Size: Large)   Pulse 80   Temp 97.5 F (36.4 C)   Ht 5' 8.2" (1.732 m)   Wt 238 lb 12.8 oz (108.3 kg)   SpO2 97%   BMI 36.10 kg/m   Wt Readings from Last 3 Encounters:  08/19/16 238 lb 12.8 oz (108.3 kg)  05/06/16 232 lb (105.2 kg)  12/16/15 234 lb (106.1 kg)    Physical Exam  Constitutional: He is oriented to person, place, and time. He appears well-developed and well-nourished. No distress.  HENT:  Head: Normocephalic and atraumatic.  Right Ear: Hearing normal.  Left Ear: Hearing normal.  Nose: Nose normal.  Eyes: Conjunctivae and lids are normal. Right eye exhibits no discharge. Left eye exhibits no discharge. No scleral  icterus.  Cardiovascular: Normal rate, regular rhythm and normal heart sounds.   Pulmonary/Chest: Effort normal and breath sounds normal. No respiratory distress.  Musculoskeletal: Normal range of motion.  Neurological: He is alert and oriented to person, place, and time.  Skin: Skin is intact. No rash noted.  Psychiatric: He has a normal mood and affect. His speech is normal and behavior is normal. Judgment and thought content normal. Cognition and memory are normal.    Results for orders placed or performed in visit on 08/19/16  Bayer DCA Hb A1c Waived  Result Value Ref Range   Bayer DCA Hb A1c Waived 7.9 (H) <7.0 %      Assessment & Plan:   Problem List Items Addressed This Visit      Cardiovascular and Mediastinum   Hypertension    The current medical regimen is effective;  continue present plan and medications.         Endocrine   Diabetes mellitus without complication (HCC) - Primary    Discussed need for better control diet exercise nutrition patient will do better      Relevant Orders   Bayer DCA Hb A1c Waived (Completed)     Other   Hyperlipidemia    The current medical regimen is effective;  continue present plan and medications.       Failed back syndrome    Discussed pain management  and use of controlled drugs. I discussed that due to the current political climate I would be unable to prescribe further narcotic prescriptions. Discuss referral to the pain clinic. Patient deferring pain clinic referral stating his pain is not that bad only intermittent use of narcotics. Patient will use Tylenol instead.       Other Visit Diagnoses    Immunization due       Relevant Orders   Flu vaccine HIGH DOSE PF (Completed)       Follow up plan: Return in about 3 months (around 11/18/2016) for Physical Exam, Hemoglobin A1c.

## 2016-08-19 NOTE — Patient Instructions (Addendum)

## 2016-09-02 ENCOUNTER — Other Ambulatory Visit: Payer: Self-pay | Admitting: Family Medicine

## 2016-09-03 NOTE — Telephone Encounter (Signed)
He should not be due until January

## 2016-10-11 ENCOUNTER — Other Ambulatory Visit: Payer: Self-pay | Admitting: Family Medicine

## 2016-11-22 ENCOUNTER — Other Ambulatory Visit: Payer: Self-pay | Admitting: Family Medicine

## 2016-11-22 MED ORDER — SITAGLIPTIN PHOSPHATE 100 MG PO TABS: 100.0000 mg | ORAL_TABLET | Freq: Every day | ORAL | 1 refills | Status: DC

## 2016-11-22 NOTE — Telephone Encounter (Signed)
Routing to provider  

## 2016-12-29 ENCOUNTER — Encounter: Payer: Federal, State, Local not specified - PPO | Admitting: Family Medicine

## 2016-12-30 ENCOUNTER — Encounter: Payer: Federal, State, Local not specified - PPO | Admitting: Family Medicine

## 2017-01-15 ENCOUNTER — Other Ambulatory Visit: Payer: Self-pay | Admitting: Family Medicine

## 2017-01-15 DIAGNOSIS — E119 Type 2 diabetes mellitus without complications: Secondary | ICD-10-CM

## 2017-01-17 NOTE — Telephone Encounter (Signed)
Last OV: 08/19/16 Next OV:01/26/17  Last A1C was 7.9

## 2017-01-18 ENCOUNTER — Other Ambulatory Visit: Payer: Self-pay | Admitting: Family Medicine

## 2017-01-18 DIAGNOSIS — I10 Essential (primary) hypertension: Secondary | ICD-10-CM

## 2017-01-26 ENCOUNTER — Encounter: Payer: Self-pay | Admitting: Family Medicine

## 2017-01-26 ENCOUNTER — Ambulatory Visit (INDEPENDENT_AMBULATORY_CARE_PROVIDER_SITE_OTHER): Payer: Federal, State, Local not specified - PPO | Admitting: Family Medicine

## 2017-01-26 ENCOUNTER — Telehealth: Payer: Self-pay | Admitting: Family Medicine

## 2017-01-26 VITALS — BP 132/78 | HR 82 | Ht 70.08 in | Wt 238.0 lb

## 2017-01-26 DIAGNOSIS — E785 Hyperlipidemia, unspecified: Secondary | ICD-10-CM

## 2017-01-26 DIAGNOSIS — E119 Type 2 diabetes mellitus without complications: Secondary | ICD-10-CM

## 2017-01-26 DIAGNOSIS — E7800 Pure hypercholesterolemia, unspecified: Secondary | ICD-10-CM | POA: Diagnosis not present

## 2017-01-26 DIAGNOSIS — N400 Enlarged prostate without lower urinary tract symptoms: Secondary | ICD-10-CM

## 2017-01-26 DIAGNOSIS — Z1159 Encounter for screening for other viral diseases: Secondary | ICD-10-CM

## 2017-01-26 DIAGNOSIS — M961 Postlaminectomy syndrome, not elsewhere classified: Secondary | ICD-10-CM

## 2017-01-26 DIAGNOSIS — I10 Essential (primary) hypertension: Secondary | ICD-10-CM | POA: Diagnosis not present

## 2017-01-26 DIAGNOSIS — Z125 Encounter for screening for malignant neoplasm of prostate: Secondary | ICD-10-CM

## 2017-01-26 DIAGNOSIS — Z1329 Encounter for screening for other suspected endocrine disorder: Secondary | ICD-10-CM

## 2017-01-26 DIAGNOSIS — Z0000 Encounter for general adult medical examination without abnormal findings: Secondary | ICD-10-CM | POA: Diagnosis not present

## 2017-01-26 LAB — MICROSCOPIC EXAMINATION
EPITHELIAL CELLS (NON RENAL): NONE SEEN /HPF (ref 0–10)
RBC, UA: NONE SEEN /hpf (ref 0–?)

## 2017-01-26 LAB — BAYER DCA HB A1C WAIVED: HB A1C: 7.2 % — AB (ref <7.0)

## 2017-01-26 LAB — URINALYSIS, ROUTINE W REFLEX MICROSCOPIC
Bilirubin, UA: NEGATIVE
Ketones, UA: NEGATIVE
Leukocytes, UA: NEGATIVE
NITRITE UA: NEGATIVE
PH UA: 5.5 (ref 5.0–7.5)
RBC, UA: NEGATIVE
Specific Gravity, UA: 1.02 (ref 1.005–1.030)
UUROB: 0.2 mg/dL (ref 0.2–1.0)

## 2017-01-26 MED ORDER — BENAZEPRIL HCL 40 MG PO TABS: 40.0000 mg | ORAL_TABLET | Freq: Every day | ORAL | 4 refills | Status: DC

## 2017-01-26 MED ORDER — AMLODIPINE BESYLATE 5 MG PO TABS: 5.0000 mg | ORAL_TABLET | Freq: Every day | ORAL | 4 refills | Status: DC

## 2017-01-26 MED ORDER — EMPAGLIFLOZIN 25 MG PO TABS: 25.0000 mg | ORAL_TABLET | Freq: Every day | ORAL | 4 refills | Status: DC

## 2017-01-26 MED ORDER — SITAGLIPTIN PHOSPHATE 100 MG PO TABS: 100.0000 mg | ORAL_TABLET | Freq: Every day | ORAL | 4 refills | Status: DC

## 2017-01-26 MED ORDER — LIRAGLUTIDE 18 MG/3ML ~~LOC~~ SOPN: 1.8000 mg | PEN_INJECTOR | Freq: Every day | SUBCUTANEOUS | 4 refills | Status: DC

## 2017-01-26 MED ORDER — SIMVASTATIN 40 MG PO TABS: 40.0000 mg | ORAL_TABLET | Freq: Every day | ORAL | 4 refills | Status: DC

## 2017-01-26 MED ORDER — METFORMIN HCL 500 MG PO TABS: 1000.0000 mg | ORAL_TABLET | Freq: Two times a day (BID) | ORAL | 4 refills | Status: DC

## 2017-01-26 MED ORDER — GLIPIZIDE 5 MG PO TABS: 5.0000 mg | ORAL_TABLET | Freq: Every day | ORAL | 4 refills | Status: DC

## 2017-01-26 NOTE — Assessment & Plan Note (Addendum)
Patient doing well off pain medication. Still hurts but not interested in going to pain clinic.

## 2017-01-26 NOTE — Assessment & Plan Note (Signed)
The current medical regimen is effective;  continue present plan and medications.  

## 2017-01-26 NOTE — Progress Notes (Signed)
BP 132/78 (BP Location: Left Arm)   Pulse 82   Ht 5' 10.08" (1.78 m)   Wt 238 lb (108 kg)   SpO2 99%   BMI 34.07 kg/m    Subjective:    Patient ID: Edward Preston, male    DOB: [birth year], 71 y.o.   MRN: [id number]  HPI: Edward Preston is a 71 y.o. male  Chief Complaint  Patient presents with  . Annual Exam  AWV metrics met   Relevant past medical, surgical, family and social history reviewed and updated as indicated. Interim medical history since our last visit reviewed. Allergies and medications reviewed and updated.  Review of Systems  Per HPI unless specifically indicated above     Objective:    BP 132/78 (BP Location: Left Arm)   Pulse 82   Ht 5' 10.08" (1.78 m)   Wt 238 lb (108 kg)   SpO2 99%   BMI 34.07 kg/m   Wt Readings from Last 3 Encounters:  01/26/17 238 lb (108 kg)  08/19/16 238 lb 12.8 oz (108.3 kg)  05/06/16 232 lb (105.2 kg)    Physical Exam  Constitutional: He is oriented to person, place, and time. He appears well-developed and well-nourished.  HENT:  Head: Normocephalic and atraumatic.  Right Ear: External ear normal.  Left Ear: External ear normal.  Eyes: Conjunctivae and EOM are normal. Pupils are equal, round, and reactive to light.  Neck: Normal range of motion. Neck supple.  Cardiovascular: Normal rate, regular rhythm, normal heart sounds and intact distal pulses.   Pulmonary/Chest: Effort normal and breath sounds normal.  Abdominal: Soft. Bowel sounds are normal. There is no splenomegaly or hepatomegaly.  Genitourinary: Rectum normal and penis normal.  Genitourinary Comments: BPH  Musculoskeletal: Normal range of motion.  Neurological: He is alert and oriented to person, place, and time. He has normal reflexes.  Skin: No rash noted. No erythema.  Psychiatric: He has a normal mood and affect. His behavior is normal. Judgment and thought content normal.    Results for orders placed or performed in visit on 08/19/16  Bayer DCA  Hb A1c Waived  Result Value Ref Range   Bayer DCA Hb A1c Waived 7.9 (H) <7.0 %      Assessment & Plan:   Problem List Items Addressed This Visit      Cardiovascular and Mediastinum   Hypertension    The current medical regimen is effective;  continue present plan and medications.       Relevant Medications   amLODipine (NORVASC) 5 MG tablet   benazepril (LOTENSIN) 40 MG tablet   simvastatin (ZOCOR) 40 MG tablet   Other Relevant Orders   CBC with Differential/Platelet   Comprehensive metabolic panel   Lipid panel   Urinalysis, Routine w reflex microscopic     Endocrine   Diabetes mellitus without complication ([place])    The current medical regimen is effective;  continue present plan and medications.       Relevant Medications   benazepril (LOTENSIN) 40 MG tablet   empagliflozin (JARDIANCE) 25 MG TABS tablet   glipiZIDE (GLUCOTROL) 5 MG tablet   metFORMIN (GLUCOPHAGE) 500 MG tablet   simvastatin (ZOCOR) 40 MG tablet   sitaGLIPtin (JANUVIA) 100 MG tablet   liraglutide (VICTOZA) 18 MG/3ML SOPN   Other Relevant Orders   CBC with Differential/Platelet   Comprehensive metabolic panel   Lipid panel   Urinalysis, Routine w reflex microscopic   Bayer DCA Hb A1c Waived (STAT)  Genitourinary   BPH (benign prostatic hyperplasia)     Other   Hyperlipidemia    The current medical regimen is effective;  continue present plan and medications.       Relevant Medications   amLODipine (NORVASC) 5 MG tablet   benazepril (LOTENSIN) 40 MG tablet   simvastatin (ZOCOR) 40 MG tablet   Other Relevant Orders   CBC with Differential/Platelet   Comprehensive metabolic panel   Lipid panel   Urinalysis, Routine w reflex microscopic   Failed back syndrome    Patient doing well off pain medication. Still hurts but not interested in going to pain clinic.       Other Visit Diagnoses    Annual physical exam    -  Primary   Relevant Orders   Hepatitis C antibody   CBC with  Differential/Platelet   Comprehensive metabolic panel   Lipid panel   PSA   Urinalysis, Routine w reflex microscopic   TSH   Need for hepatitis C screening test       Relevant Orders   Hepatitis C antibody   Prostate cancer screening       Relevant Orders   PSA   Thyroid disorder screen       Relevant Orders   TSH   Essential hypertension, benign       Relevant Medications   amLODipine (NORVASC) 5 MG tablet   benazepril (LOTENSIN) 40 MG tablet   simvastatin (ZOCOR) 40 MG tablet       Follow up plan: Return in about 3 months (around 04/25/2017) for Hemoglobin A1c.

## 2017-01-26 NOTE — Telephone Encounter (Signed)
Spoke with wife. [facility] stated that 6 ml is a 6 month supply instead of a 3 month supply, and they cannot fill anything over a 3 month supply. Changed dispense amount form 6 ml to 3 ml.

## 2017-01-27 ENCOUNTER — Encounter: Payer: Self-pay | Admitting: Family Medicine

## 2017-01-27 LAB — CBC WITH DIFFERENTIAL/PLATELET
Basophils Absolute: 0 10*3/uL (ref 0.0–0.2)
Basos: 0 %
EOS (ABSOLUTE): 0.4 10*3/uL (ref 0.0–0.4)
Eos: 5 %
HEMOGLOBIN: 14.7 g/dL (ref 13.0–17.7)
Hematocrit: 43.7 % (ref 37.5–51.0)
IMMATURE GRANULOCYTES: 0 %
Immature Grans (Abs): 0 10*3/uL (ref 0.0–0.1)
LYMPHS ABS: 2.3 10*3/uL (ref 0.7–3.1)
Lymphs: 32 %
MCH: 29.7 pg (ref 26.6–33.0)
MCHC: 33.6 g/dL (ref 31.5–35.7)
MCV: 88 fL (ref 79–97)
MONOS ABS: 0.6 10*3/uL (ref 0.1–0.9)
Monocytes: 8 %
NEUTROS PCT: 55 %
Neutrophils Absolute: 4 10*3/uL (ref 1.4–7.0)
Platelets: 154 10*3/uL (ref 150–379)
RBC: 4.95 x10E6/uL (ref 4.14–5.80)
RDW: 13.7 % (ref 12.3–15.4)
WBC: 7.2 10*3/uL (ref 3.4–10.8)

## 2017-01-27 LAB — COMPREHENSIVE METABOLIC PANEL
A/G RATIO: 1.8 (ref 1.2–2.2)
ALBUMIN: 4.9 g/dL — AB (ref 3.5–4.8)
ALK PHOS: 67 IU/L (ref 39–117)
ALT: 21 IU/L (ref 0–44)
AST: 17 IU/L (ref 0–40)
BILIRUBIN TOTAL: 0.4 mg/dL (ref 0.0–1.2)
BUN / CREAT RATIO: 18 (ref 10–24)
BUN: 19 mg/dL (ref 8–27)
CO2: 21 mmol/L (ref 18–29)
CREATININE: 1.05 mg/dL (ref 0.76–1.27)
Calcium: 9.5 mg/dL (ref 8.6–10.2)
Chloride: 95 mmol/L — ABNORMAL LOW (ref 96–106)
GFR calc Af Amer: 82 mL/min/{1.73_m2} (ref >59)
GFR calc non Af Amer: 71 mL/min/{1.73_m2} (ref >59)
GLOBULIN, TOTAL: 2.7 g/dL (ref 1.5–4.5)
Glucose: 116 mg/dL — ABNORMAL HIGH (ref 65–99)
POTASSIUM: 4.1 mmol/L (ref 3.5–5.2)
SODIUM: 138 mmol/L (ref 134–144)
Total Protein: 7.6 g/dL (ref 6.0–8.5)

## 2017-01-27 LAB — LIPID PANEL
CHOLESTEROL TOTAL: 149 mg/dL (ref 100–199)
Chol/HDL Ratio: 3.8 ratio units (ref 0.0–5.0)
HDL: 39 mg/dL — ABNORMAL LOW (ref >39)
LDL CALC: 72 mg/dL (ref 0–99)
Triglycerides: 188 mg/dL — ABNORMAL HIGH (ref 0–149)
VLDL Cholesterol Cal: 38 mg/dL (ref 5–40)

## 2017-01-27 LAB — HEPATITIS C ANTIBODY: Hep C Virus Ab: 0.3 s/co ratio (ref 0.0–0.9)

## 2017-01-27 LAB — PSA: Prostate Specific Ag, Serum: 0.7 ng/mL (ref 0.0–4.0)

## 2017-01-27 LAB — TSH: TSH: 2.97 u[IU]/mL (ref 0.450–4.500)

## 2017-01-28 ENCOUNTER — Other Ambulatory Visit: Payer: Self-pay

## 2017-01-28 NOTE — Telephone Encounter (Signed)
Last OV: 01/26/17 Next OV: 05/04/17       Pharmacy requesting new RX with 3 Mls. Verbal was previously given to change.

## 2017-02-18 ENCOUNTER — Other Ambulatory Visit: Payer: Self-pay | Admitting: Family Medicine

## 2017-02-18 DIAGNOSIS — E119 Type 2 diabetes mellitus without complications: Secondary | ICD-10-CM

## 2017-03-15 ENCOUNTER — Encounter: Payer: Federal, State, Local not specified - PPO | Admitting: Family Medicine

## 2017-04-11 ENCOUNTER — Other Ambulatory Visit: Payer: Self-pay | Admitting: Family Medicine

## 2017-04-11 NOTE — Telephone Encounter (Signed)
Your patient 

## 2017-05-04 ENCOUNTER — Ambulatory Visit (INDEPENDENT_AMBULATORY_CARE_PROVIDER_SITE_OTHER): Payer: Federal, State, Local not specified - PPO | Admitting: Family Medicine

## 2017-05-04 ENCOUNTER — Telehealth: Payer: Self-pay

## 2017-05-04 ENCOUNTER — Encounter: Payer: Self-pay | Admitting: Family Medicine

## 2017-05-04 ENCOUNTER — Other Ambulatory Visit: Payer: Self-pay | Admitting: Family Medicine

## 2017-05-04 VITALS — BP 140/90 | HR 82 | Wt 238.0 lb

## 2017-05-04 DIAGNOSIS — I10 Essential (primary) hypertension: Secondary | ICD-10-CM

## 2017-05-04 DIAGNOSIS — E119 Type 2 diabetes mellitus without complications: Secondary | ICD-10-CM | POA: Diagnosis not present

## 2017-05-04 DIAGNOSIS — E785 Hyperlipidemia, unspecified: Secondary | ICD-10-CM | POA: Diagnosis not present

## 2017-05-04 LAB — BAYER DCA HB A1C WAIVED: HB A1C: 8 % — AB (ref <7.0)

## 2017-05-04 MED ORDER — INSULIN DEGLUDEC-LIRAGLUTIDE 100-3.6 UNIT-MG/ML ~~LOC~~ SOPN: 20.0000 [IU] | PEN_INJECTOR | Freq: Every morning | SUBCUTANEOUS | 12 refills | Status: DC

## 2017-05-04 NOTE — Telephone Encounter (Signed)
Pt was encouraged by insurance to try local pharmacy for better price.

## 2017-05-04 NOTE — Assessment & Plan Note (Signed)
Continued poor control of diabetes discussed extensively weight loss diet nutrition exercise patient will try to comply. We will stop Victoza xultripy 16u Extensive patient education given and patient given first dose in the office.

## 2017-05-04 NOTE — Telephone Encounter (Signed)
Left message on machine for pt to return call to the office.  

## 2017-05-04 NOTE — Telephone Encounter (Signed)
Patient's wife called to ask if patient could get the sample medicine (Xultothy 100[facility]3.6) [facility] [place] [place]. Patient is unable to [facility] and was told by the insurance company to try and get the medication sent to another pharmacy.   Please Advise.  Thank you

## 2017-05-04 NOTE — Progress Notes (Signed)
BP 140/90 (BP Location: Left Arm)   Pulse 82   Wt 238 lb (108 kg)   SpO2 98%   BMI 34.07 kg/m    Subjective:    Patient ID: Edward Preston, male    DOB: December 01, 1946, 71 y.o.   MRN: [id number]  HPI: Edward Preston is a 71 y.o. male  Chief Complaint  Patient presents with  . Follow-up  . Diabetes  Patient still struggling with diabetic control weight loss nutrition taking medications faithfully. Accompanied by his wife who assists with history.  Taking blood pressure medicines faithfully and pressure remains elevated also.  Relevant past medical, surgical, family and social history reviewed and updated as indicated. Interim medical history since our last visit reviewed. Allergies and medications reviewed and updated.  Review of Systems  Constitutional: Negative.   Respiratory: Negative.   Cardiovascular: Negative.     Per HPI unless specifically indicated above     Objective:    BP 140/90 (BP Location: Left Arm)   Pulse 82   Wt 238 lb (108 kg)   SpO2 98%   BMI 34.07 kg/m   Wt Readings from Last 3 Encounters:  05/04/17 238 lb (108 kg)  01/26/17 238 lb (108 kg)  08/19/16 238 lb 12.8 oz (108.3 kg)    Physical Exam  Constitutional: He is oriented to person, place, and time. He appears well-developed and well-nourished.  HENT:  Head: Normocephalic and atraumatic.  Eyes: Conjunctivae and EOM are normal.  Neck: Normal range of motion.  Cardiovascular: Normal rate, regular rhythm and normal heart sounds.   Pulmonary/Chest: Effort normal and breath sounds normal.  Musculoskeletal: Normal range of motion.  Neurological: He is alert and oriented to person, place, and time.  Skin: No erythema.  Psychiatric: He has a normal mood and affect. His behavior is normal. Judgment and thought content normal.    Results for orders placed or performed in visit on 01/26/17  Microscopic Examination  Result Value Ref Range   WBC, UA 0-5 0 - 5 /hpf   RBC, UA None seen 0 - 2  /hpf   Epithelial Cells (non renal) None seen 0 - 10 /hpf   Bacteria, UA Few (A) None seen/Few  Hepatitis C antibody  Result Value Ref Range   Hep C Virus Ab 0.3 0.0 - 0.9 s/co ratio  CBC with Differential/Platelet  Result Value Ref Range   WBC 7.2 3.4 - 10.8 x10E3/uL   RBC 4.95 4.14 - 5.80 x10E6/uL   Hemoglobin 14.7 13.0 - 17.7 g/dL   Hematocrit 40.9 81.1 - 51.0 %   MCV 88 79 - 97 fL   MCH 29.7 26.6 - 33.0 pg   MCHC 33.6 31.5 - 35.7 g/dL   RDW 91.4 78.2 - 95.6 %   Platelets 154 150 - 379 x10E3/uL   Neutrophils 55 Not Estab. %   Lymphs 32 Not Estab. %   Monocytes 8 Not Estab. %   Eos 5 Not Estab. %   Basos 0 Not Estab. %   Neutrophils Absolute 4.0 1.4 - 7.0 x10E3/uL   Lymphocytes Absolute 2.3 0.7 - 3.1 x10E3/uL   Monocytes Absolute 0.6 0.1 - 0.9 x10E3/uL   EOS (ABSOLUTE) 0.4 0.0 - 0.4 x10E3/uL   Basophils Absolute 0.0 0.0 - 0.2 x10E3/uL   Immature Granulocytes 0 Not Estab. %   Immature Grans (Abs) 0.0 0.0 - 0.1 x10E3/uL  Comprehensive metabolic panel  Result Value Ref Range   Glucose 116 (H) 65 - 99 mg/dL  BUN 19 8 - 27 mg/dL   Creatinine, Ser 1.611.05 0.76 - 1.27 mg/dL   GFR calc non Af Amer 71 >59 mL/min/1.73   GFR calc Af Amer 82 >59 mL/min/1.73   BUN/Creatinine Ratio 18 10 - 24   Sodium 138 134 - 144 mmol/L   Potassium 4.1 3.5 - 5.2 mmol/L   Chloride 95 (L) 96 - 106 mmol/L   CO2 21 18 - 29 mmol/L   Calcium 9.5 8.6 - 10.2 mg/dL   Total Protein 7.6 6.0 - 8.5 g/dL   Albumin 4.9 (H) 3.5 - 4.8 g/dL   Globulin, Total 2.7 1.5 - 4.5 g/dL   Albumin/Globulin Ratio 1.8 1.2 - 2.2   Bilirubin Total 0.4 0.0 - 1.2 mg/dL   Alkaline Phosphatase 67 39 - 117 IU/L   AST 17 0 - 40 IU/L   ALT 21 0 - 44 IU/L  Lipid panel  Result Value Ref Range   Cholesterol, Total 149 100 - 199 mg/dL   Triglycerides 096188 (H) 0 - 149 mg/dL   HDL 39 (L) >04>39 mg/dL   VLDL Cholesterol Cal 38 5 - 40 mg/dL   LDL Calculated 72 0 - 99 mg/dL   Chol/HDL Ratio 3.8 0.0 - 5.0 ratio units  PSA  Result Value Ref  Range   Prostate Specific Ag, Serum 0.7 0.0 - 4.0 ng/mL  Urinalysis, Routine w reflex microscopic  Result Value Ref Range   Specific Gravity, UA 1.020 1.005 - 1.030   pH, UA 5.5 5.0 - 7.5   Color, UA Yellow Yellow   Appearance Ur Clear Clear   Leukocytes, UA Negative Negative   Protein, UA 2+ (A) Negative/Trace   Glucose, UA 2+ (A) Negative   Ketones, UA Negative Negative   RBC, UA Negative Negative   Bilirubin, UA Negative Negative   Urobilinogen, Ur 0.2 0.2 - 1.0 mg/dL   Nitrite, UA Negative Negative   Microscopic Examination See below:   TSH  Result Value Ref Range   TSH 2.970 0.450 - 4.500 uIU/mL  Bayer DCA Hb A1c Waived (STAT)  Result Value Ref Range   Bayer DCA Hb A1c Waived 7.2 (H) <7.0 %      Assessment & Plan:   Problem List Items Addressed This Visit      Cardiovascular and Mediastinum   Hypertension    Blood pressure elevated today but patient's blood sugar also markedly elevated will treat diabetes first patient will work on salt reduction and weight reduction for blood pressure control.      Relevant Orders   Bayer DCA Hb A1c Waived     Endocrine   Diabetes mellitus without complication (H[facility]) - Primary    Continued poor control of diabetes discussed extensively weight loss diet nutrition exercise patient will try to comply. We will stop Victoza xultripy 16u Extensive patient education given and patient given first dose in the office.        Relevant Medications   Insulin Degludec-Liraglutide (XULTOPHY) 100-3.6 UNIT-MG/ML SOPN   Other Relevant Orders   Bayer DCA Hb A1c Waived     Other   Hyperlipidemia   Relevant Orders   Bayer DCA Hb A1c Waived       Follow up plan: Return in about 4 weeks (around 06/01/2017) for Med check.

## 2017-05-04 NOTE — Assessment & Plan Note (Signed)
Blood pressure elevated today but patient's blood sugar also markedly elevated will treat diabetes first patient will work on salt reduction and weight reduction for blood pressure control.

## 2017-05-05 NOTE — Telephone Encounter (Signed)
RX was corrected via phone to 90 day supply at local pharmacy.

## 2017-05-06 ENCOUNTER — Other Ambulatory Visit: Payer: Self-pay | Admitting: Family Medicine

## 2017-05-06 DIAGNOSIS — E119 Type 2 diabetes mellitus without complications: Secondary | ICD-10-CM

## 2017-05-06 MED ORDER — INSULIN DEGLUDEC-LIRAGLUTIDE 100-3.6 UNIT-MG/ML ~~LOC~~ SOPN: 20.0000 [IU] | PEN_INJECTOR | Freq: Every morning | SUBCUTANEOUS | 12 refills | Status: DC

## 2017-05-06 NOTE — Telephone Encounter (Signed)
Rx changed and sent to [facility]

## 2017-05-06 NOTE — Telephone Encounter (Signed)
I spoke to [place] at [facility] in regards to [facility]. Dr. Dossie Arbourrissman had given the pharmacy verbal order for a 90 day supply. A box comes as 5 pens. In order for them to get insurance to cover it. It needs to be written for 2 boxes, and directions to be a sliding scale of 20-24 units a day.

## 2017-05-06 NOTE — Telephone Encounter (Signed)
[facility] needs clarification on [employer] ASAP.Marland Kitchen. Thanks  [phone]

## 2017-05-06 NOTE — Telephone Encounter (Signed)
Angie with [facility] needs clarification on the quantity of a prescription that was sent to them. Ref# [id number]  [phone]  Thanks

## 2017-05-10 ENCOUNTER — Telehealth: Payer: Self-pay | Admitting: Family Medicine

## 2017-05-10 NOTE — Telephone Encounter (Addendum)
Patients wife called back regarding the [facility] that was [facility] needs to be [facility] because [facility] the script was pre approved but will cost 950.00.Marland Kitchen.Marland Kitchen.Xultophy   Call patients wife if any questions.  Thanks

## 2017-05-10 NOTE — Telephone Encounter (Signed)
Please advise. P.A. Was approved. Copay will still be over 900$. Insurance stated a Tier exception can be done, unsure of how much this will reduce cost or medication can be changed. Please advise

## 2017-05-10 NOTE — Telephone Encounter (Signed)
Did a PA on medication. Medication was approved but covermymeds called and said that the co-pay is still a little over $900. Unable to get savings card for patient because he is apart of a state plan.

## 2017-05-11 NOTE — Telephone Encounter (Signed)
Call pt 

## 2017-05-12 ENCOUNTER — Telehealth: Payer: Self-pay | Admitting: Family Medicine

## 2017-05-12 NOTE — Telephone Encounter (Signed)
Attempted to reach, no answer.

## 2017-05-12 NOTE — Telephone Encounter (Signed)
Patient's wife called to return missed call from provider.

## 2017-05-12 NOTE — Telephone Encounter (Signed)
Left message on machine for pt to return call to the office.  

## 2017-05-12 NOTE — Telephone Encounter (Signed)
Confusion reports from patient but with using prescription card medication comes down to a dollar a day we'll continue with prescription as prescribed.

## 2017-05-12 NOTE — Telephone Encounter (Signed)
Noted. Will call back w/ afternoon phone calls. Please see prior telephone encounter.

## 2017-05-30 ENCOUNTER — Other Ambulatory Visit: Payer: Self-pay | Admitting: Family Medicine

## 2017-05-30 DIAGNOSIS — E119 Type 2 diabetes mellitus without complications: Secondary | ICD-10-CM

## 2017-05-30 NOTE — Telephone Encounter (Signed)
Patient's wife called to see if provider could write patient a prescription for novonordisk needles. Pharmacy informed patient and wife that in order to get prescription, provider must prescribe it. Patient and wife would like to get it because it would be covered under discount card. Patient's wife made aware pcp is out of office.    Please Advise.  Thank you.

## 2017-05-31 MED ORDER — INSULIN PEN NEEDLE 32G X 4 MM MISC: 1.0000 | Freq: Two times a day (BID) | 5 refills | Status: DC

## 2017-05-31 NOTE — Telephone Encounter (Signed)
Please advise. RX is T'd up if appropriate.

## 2017-06-11 ENCOUNTER — Other Ambulatory Visit: Payer: Self-pay | Admitting: Family Medicine

## 2017-06-11 DIAGNOSIS — E119 Type 2 diabetes mellitus without complications: Secondary | ICD-10-CM

## 2017-06-16 ENCOUNTER — Other Ambulatory Visit: Payer: Self-pay | Admitting: Family Medicine

## 2017-06-16 DIAGNOSIS — E119 Type 2 diabetes mellitus without complications: Secondary | ICD-10-CM

## 2017-07-19 ENCOUNTER — Telehealth: Payer: Self-pay | Admitting: Family Medicine

## 2017-07-19 NOTE — Telephone Encounter (Signed)
[employer] disability placard form dropped off.  Explained Dr Dossie Arbourrissman will be back next week. Call when completed  Thanks  [phone]

## 2017-07-19 NOTE — Telephone Encounter (Signed)
Form on Dr. Christell Faithrissman's desk for signature.

## 2017-07-25 NOTE — Telephone Encounter (Signed)
Wife's aware.

## 2017-09-12 ENCOUNTER — Other Ambulatory Visit: Payer: Self-pay | Admitting: Family Medicine

## 2017-09-12 DIAGNOSIS — E119 Type 2 diabetes mellitus without complications: Secondary | ICD-10-CM

## 2017-09-12 MED ORDER — EMPAGLIFLOZIN 25 MG PO TABS: 25.0000 mg | ORAL_TABLET | Freq: Every day | ORAL | 0 refills | Status: DC

## 2017-09-12 NOTE — Telephone Encounter (Signed)
Routing to provider. Appt on 09/20/17.

## 2017-09-12 NOTE — Telephone Encounter (Signed)
Patient needing refill on prescription for Jardiance tablets (90 day supply[facility] [place]. Patient has six tablets left of medication.   Please Advise.  Thank you

## 2017-09-20 ENCOUNTER — Ambulatory Visit (INDEPENDENT_AMBULATORY_CARE_PROVIDER_SITE_OTHER): Payer: Federal, State, Local not specified - PPO | Admitting: Family Medicine

## 2017-09-20 ENCOUNTER — Encounter: Payer: Self-pay | Admitting: Family Medicine

## 2017-09-20 VITALS — BP 139/83 | HR 84 | Wt 238.0 lb

## 2017-09-20 DIAGNOSIS — I10 Essential (primary) hypertension: Secondary | ICD-10-CM | POA: Diagnosis not present

## 2017-09-20 DIAGNOSIS — Z23 Encounter for immunization: Secondary | ICD-10-CM

## 2017-09-20 DIAGNOSIS — E119 Type 2 diabetes mellitus without complications: Secondary | ICD-10-CM

## 2017-09-20 DIAGNOSIS — E785 Hyperlipidemia, unspecified: Secondary | ICD-10-CM

## 2017-09-20 LAB — BAYER DCA HB A1C WAIVED: HB A1C: 6.7 % (ref <7.0)

## 2017-09-20 MED ORDER — EMPAGLIFLOZIN 25 MG PO TABS: 25.0000 mg | ORAL_TABLET | Freq: Every day | ORAL | 1 refills | Status: DC

## 2017-09-20 NOTE — Assessment & Plan Note (Signed)
The current medical regimen is effective;  continue present plan and medications.  

## 2017-09-20 NOTE — Progress Notes (Signed)
   BP 139/83   Pulse 84   Wt 238 lb (108 kg)   SpO2 98%   BMI 34.07 kg/m    Subjective:    Patient ID: Edward Preston, male    DOB: 1946/05/02, 71 y.o.   MRN: [id number]  HPI: Edward Preston is a 71 y.o. male  Chief Complaint  Patient presents with  . Diabetes   Patient follow-up accompanied by his wife who assists with history. Patient's diabetes doing well with no complaints taking insulin with no problems and good control. No low blood sugar spells. Taking 16 units a day without problems. Patient all in all doing okay still the usual aches and pains. Blood pressure doing well no complaints or problems also the same with cholesterol.  Relevant past medical, surgical, family and social history reviewed and updated as indicated. Interim medical history since our last visit reviewed. Allergies and medications reviewed and updated.  Review of Systems  Constitutional: Negative.   Respiratory: Negative.   Cardiovascular: Negative.     Per HPI unless specifically indicated above     Objective:    BP 139/83   Pulse 84   Wt 238 lb (108 kg)   SpO2 98%   BMI 34.07 kg/m   Wt Readings from Last 3 Encounters:  09/20/17 238 lb (108 kg)  05/04/17 238 lb (108 kg)  01/26/17 238 lb (108 kg)    Physical Exam  Constitutional: He is oriented to person, place, and time. He appears well-developed and well-nourished.  HENT:  Head: Normocephalic and atraumatic.  Eyes: Conjunctivae and EOM are normal.  Neck: Normal range of motion.  Cardiovascular: Normal rate, regular rhythm and normal heart sounds.   Pulmonary/Chest: Effort normal and breath sounds normal.  Musculoskeletal: Normal range of motion.  Neurological: He is alert and oriented to person, place, and time.  Skin: No erythema.  Psychiatric: He has a normal mood and affect. His behavior is normal. Judgment and thought content normal.    Results for orders placed or performed in visit on 05/04/17  Bayer DCA Hb A1c  Waived  Result Value Ref Range   Bayer DCA Hb A1c Waived 8.0 (H) <7.0 %      Assessment & Plan:   Problem List Items Addressed This Visit      Cardiovascular and Mediastinum   Hypertension    The current medical regimen is effective;  continue present plan and medications.         Endocrine   Diabetes mellitus without complication (HCC) - Primary    Patient for the first time doing well with diabetes noted low blood sugar spells and all in all doing well will continue current medication and treatment. Again encouraged weight loss exercise      Relevant Medications   empagliflozin (JARDIANCE) 25 MG TABS tablet   Other Relevant Orders   Bayer DCA Hb A1c Waived     Other   Hyperlipidemia    The current medical regimen is effective;  continue present plan and medications.        Other Visit Diagnoses    Needs flu shot       Relevant Orders   Flu vaccine HIGH DOSE PF (Fluzone High dose) (Completed)       Follow up plan: Return in about 3 months (around 12/21/2017) for Physical Exam, Hemoglobin A1c.

## 2017-09-20 NOTE — Assessment & Plan Note (Signed)
Patient for the first time doing well with diabetes noted low blood sugar spells and all in all doing well will continue current medication and treatment. Again encouraged weight loss exercise

## 2017-10-08 ENCOUNTER — Other Ambulatory Visit: Payer: Self-pay | Admitting: Family Medicine

## 2017-10-08 DIAGNOSIS — E119 Type 2 diabetes mellitus without complications: Secondary | ICD-10-CM

## 2017-10-20 ENCOUNTER — Other Ambulatory Visit: Payer: Self-pay | Admitting: Family Medicine

## 2017-10-20 DIAGNOSIS — E119 Type 2 diabetes mellitus without complications: Secondary | ICD-10-CM

## 2018-01-20 ENCOUNTER — Other Ambulatory Visit: Payer: Self-pay | Admitting: Family Medicine

## 2018-01-20 ENCOUNTER — Encounter: Payer: Self-pay | Admitting: Family Medicine

## 2018-01-20 DIAGNOSIS — E119 Type 2 diabetes mellitus without complications: Secondary | ICD-10-CM

## 2018-01-31 ENCOUNTER — Other Ambulatory Visit: Payer: Self-pay

## 2018-01-31 DIAGNOSIS — E119 Type 2 diabetes mellitus without complications: Secondary | ICD-10-CM

## 2018-01-31 MED ORDER — METFORMIN HCL 500 MG PO TABS: 1000.0000 mg | ORAL_TABLET | Freq: Two times a day (BID) | ORAL | 1 refills | Status: DC

## 2018-02-15 ENCOUNTER — Encounter: Payer: Federal, State, Local not specified - PPO | Admitting: Family Medicine

## 2018-02-23 ENCOUNTER — Other Ambulatory Visit: Payer: Self-pay | Admitting: Family Medicine

## 2018-02-23 DIAGNOSIS — I10 Essential (primary) hypertension: Secondary | ICD-10-CM

## 2018-02-23 NOTE — Telephone Encounter (Signed)
LOV 09-20-17 with Dr. Dossie Arbourrissman / Refill request for Norvasc

## 2018-03-01 ENCOUNTER — Telehealth: Payer: Self-pay | Admitting: Family Medicine

## 2018-03-01 DIAGNOSIS — E119 Type 2 diabetes mellitus without complications: Secondary | ICD-10-CM

## 2018-03-01 MED ORDER — METFORMIN HCL 500 MG PO TABS: 1000.0000 mg | ORAL_TABLET | Freq: Two times a day (BID) | ORAL | 1 refills | Status: DC

## 2018-03-01 NOTE — Telephone Encounter (Signed)
Copied from CRM #[id number]. Topic: Quick [id number] - Rx Refill/Question >> Mar 01, 2018  9:17 AM Percival SpanishKennedy, Cheryl W wrote: Medication metFORMIN (GLUCOPHAGE) 500 MG tablet     Pt said his RX should be for a 90 day supply which would be 360 tablets. He did pick up a RX on 02/28/18 and the RX was for only a 180 tablets  Has the patient contacted their pharmacy yes    [facility] [place]   Agent: Please be advised that RX refills may take up to 3 business days. We ask that you follow-up with your pharmacy.

## 2018-03-01 NOTE — Telephone Encounter (Signed)
Please advise. Pt requesting 90 day supply of metformin.

## 2018-04-03 ENCOUNTER — Encounter: Payer: Federal, State, Local not specified - PPO | Admitting: Family Medicine

## 2018-04-09 ENCOUNTER — Other Ambulatory Visit: Payer: Self-pay | Admitting: Family Medicine

## 2018-04-09 DIAGNOSIS — I10 Essential (primary) hypertension: Secondary | ICD-10-CM

## 2018-04-09 DIAGNOSIS — E119 Type 2 diabetes mellitus without complications: Secondary | ICD-10-CM

## 2018-06-14 ENCOUNTER — Ambulatory Visit (INDEPENDENT_AMBULATORY_CARE_PROVIDER_SITE_OTHER): Payer: Federal, State, Local not specified - PPO | Admitting: Family Medicine

## 2018-06-14 ENCOUNTER — Encounter: Payer: Self-pay | Admitting: Family Medicine

## 2018-06-14 ENCOUNTER — Other Ambulatory Visit: Payer: Self-pay | Admitting: Family Medicine

## 2018-06-14 VITALS — BP 129/77 | HR 83 | Ht 68.31 in | Wt 237.0 lb

## 2018-06-14 DIAGNOSIS — Z0001 Encounter for general adult medical examination with abnormal findings: Secondary | ICD-10-CM

## 2018-06-14 DIAGNOSIS — M961 Postlaminectomy syndrome, not elsewhere classified: Secondary | ICD-10-CM | POA: Diagnosis not present

## 2018-06-14 DIAGNOSIS — Z1329 Encounter for screening for other suspected endocrine disorder: Secondary | ICD-10-CM

## 2018-06-14 DIAGNOSIS — Z125 Encounter for screening for malignant neoplasm of prostate: Secondary | ICD-10-CM

## 2018-06-14 DIAGNOSIS — N400 Enlarged prostate without lower urinary tract symptoms: Secondary | ICD-10-CM

## 2018-06-14 DIAGNOSIS — Z7189 Other specified counseling: Secondary | ICD-10-CM | POA: Diagnosis not present

## 2018-06-14 DIAGNOSIS — I10 Essential (primary) hypertension: Secondary | ICD-10-CM | POA: Diagnosis not present

## 2018-06-14 DIAGNOSIS — E785 Hyperlipidemia, unspecified: Secondary | ICD-10-CM

## 2018-06-14 DIAGNOSIS — Z0000 Encounter for general adult medical examination without abnormal findings: Secondary | ICD-10-CM

## 2018-06-14 DIAGNOSIS — Z23 Encounter for immunization: Secondary | ICD-10-CM

## 2018-06-14 DIAGNOSIS — E119 Type 2 diabetes mellitus without complications: Secondary | ICD-10-CM | POA: Diagnosis not present

## 2018-06-14 DIAGNOSIS — E7800 Pure hypercholesterolemia, unspecified: Secondary | ICD-10-CM

## 2018-06-14 LAB — MICROSCOPIC EXAMINATION: BACTERIA UA: NONE SEEN

## 2018-06-14 LAB — URINALYSIS, ROUTINE W REFLEX MICROSCOPIC
Bilirubin, UA: NEGATIVE
Ketones, UA: NEGATIVE
Leukocytes, UA: NEGATIVE
Nitrite, UA: NEGATIVE
PH UA: 6 (ref 5.0–7.5)
RBC, UA: NEGATIVE
Specific Gravity, UA: 1.015 (ref 1.005–1.030)
Urobilinogen, Ur: 0.2 mg/dL (ref 0.2–1.0)

## 2018-06-14 LAB — BAYER DCA HB A1C WAIVED: HB A1C (BAYER DCA - WAIVED): 6.6 % (ref <7.0)

## 2018-06-14 MED ORDER — GLIPIZIDE 5 MG PO TABS: 5.0000 mg | ORAL_TABLET | Freq: Every day | ORAL | 4 refills | Status: DC

## 2018-06-14 MED ORDER — BENAZEPRIL HCL 40 MG PO TABS: 40.0000 mg | ORAL_TABLET | Freq: Every day | ORAL | 4 refills | Status: DC

## 2018-06-14 MED ORDER — SITAGLIPTIN PHOSPHATE 100 MG PO TABS: 100.0000 mg | ORAL_TABLET | Freq: Every day | ORAL | 4 refills | Status: DC

## 2018-06-14 MED ORDER — INSULIN PEN NEEDLE 32G X 4 MM MISC: 1.0000 | Freq: Two times a day (BID) | 5 refills | Status: AC

## 2018-06-14 MED ORDER — AMLODIPINE BESYLATE 5 MG PO TABS: 5.0000 mg | ORAL_TABLET | Freq: Every day | ORAL | 4 refills | Status: DC

## 2018-06-14 MED ORDER — METFORMIN HCL 500 MG PO TABS: 1000.0000 mg | ORAL_TABLET | Freq: Two times a day (BID) | ORAL | 4 refills | Status: DC

## 2018-06-14 MED ORDER — INSULIN DEGLUDEC-LIRAGLUTIDE 100-3.6 UNIT-MG/ML ~~LOC~~ SOPN: 20.0000 [IU] | PEN_INJECTOR | Freq: Every morning | SUBCUTANEOUS | 12 refills | Status: DC

## 2018-06-14 MED ORDER — EMPAGLIFLOZIN 25 MG PO TABS: 25.0000 mg | ORAL_TABLET | Freq: Every day | ORAL | 4 refills | Status: DC

## 2018-06-14 NOTE — Assessment & Plan Note (Signed)
The current medical regimen is effective;  continue present plan and medications.  

## 2018-06-14 NOTE — Telephone Encounter (Signed)
Jardiance 25 mg refill request  Has appt today (7/3) at 8:45 with Dr. Dossie Arbourrissman.   Refill during OV in case of changes.

## 2018-06-14 NOTE — Progress Notes (Signed)
BP 129/77   Pulse 83   Ht 5' 8.31" (1.735 m)   Wt 237 lb (107.5 kg)   SpO2 98%   BMI 35.71 kg/m    Subjective:    Patient ID: Edward Preston, male    DOB: [birth year], 72 y.o.   MRN: [id number]  HPI: Edward Preston is a 72 y.o. male  Chief Complaint  Patient presents with  . Annual Exam  Patient all in all doing well accompanied by wife who assists with history. Blood pressure doing well with medications no complaints. Diabetes doing well no low blood sugar spells or issues concerns managing insulin without problems. Chronic back pain stable does resting occasional walking and some sitting.  All in all doing okay. Cholesterol not taking medications BPH stable.  Relevant past medical, surgical, family and social history reviewed and updated as indicated. Interim medical history since our last visit reviewed. Allergies and medications reviewed and updated.  Review of Systems  Constitutional: Negative.   HENT: Negative.   Eyes: Negative.   Respiratory: Negative.   Cardiovascular: Negative.   Gastrointestinal: Negative.   Endocrine: Negative.   Genitourinary: Negative.   Musculoskeletal: Negative.   Skin: Negative.   Allergic/Immunologic: Negative.   Neurological: Negative.   Hematological: Negative.   Psychiatric/Behavioral: Negative.     Per HPI unless specifically indicated above     Objective:    BP 129/77   Pulse 83   Ht 5' 8.31" (1.735 m)   Wt 237 lb (107.5 kg)   SpO2 98%   BMI 35.71 kg/m   Wt Readings from Last 3 Encounters:  06/14/18 237 lb (107.5 kg)  09/20/17 238 lb (108 kg)  05/04/17 238 lb (108 kg)    Physical Exam  Constitutional: He is oriented to person, place, and time. He appears well-developed and well-nourished.  HENT:  Head: Normocephalic and atraumatic.  Right Ear: External ear normal.  Left Ear: External ear normal.  Eyes: Pupils are equal, round, and reactive to light. Conjunctivae and EOM are normal.  Neck: Normal range of  motion. Neck supple.  Cardiovascular: Normal rate, regular rhythm, normal heart sounds and intact distal pulses.  Pulmonary/Chest: Effort normal and breath sounds normal.  Abdominal: Soft. Bowel sounds are normal. There is no splenomegaly or hepatomegaly.  Genitourinary: Rectum normal and penis normal.  Genitourinary Comments: BPH changes  Musculoskeletal: Normal range of motion.  Neurological: He is alert and oriented to person, place, and time. He has normal reflexes.  Skin: No rash noted. No erythema.  Psychiatric: He has a normal mood and affect. His behavior is normal. Judgment and thought content normal.    Results for orders placed or performed in visit on 09/20/17  Bayer DCA Hb A1c Waived  Result Value Ref Range   HB A1C (BAYER DCA - WAIVED) 6.7 <7.0 %      Assessment & Plan:   Problem List Items Addressed This Visit      Cardiovascular and Mediastinum   Hypertension - Primary    The current medical regimen is effective;  continue present plan and medications.       Relevant Medications   benazepril (LOTENSIN) 40 MG tablet   amLODipine (NORVASC) 5 MG tablet   Other Relevant Orders   CBC with Differential/Platelet   Comprehensive metabolic panel   Lipid panel   Bayer DCA Hb A1c Waived     Endocrine   Diabetes mellitus without complication (HCC)    The current medical regimen is effective;  continue present plan and medications.       Relevant Medications   Insulin Degludec-Liraglutide (XULTOPHY) 100-3.6 UNIT-MG/ML SOPN   Insulin Pen Needle 32G X 4 MM MISC   sitaGLIPtin (JANUVIA) 100 MG tablet   metFORMIN (GLUCOPHAGE) 500 MG tablet   glipiZIDE (GLUCOTROL) 5 MG tablet   empagliflozin (JARDIANCE) 25 MG TABS tablet   benazepril (LOTENSIN) 40 MG tablet   Other Relevant Orders   CBC with Differential/Platelet   Comprehensive metabolic panel   Lipid panel   Urinalysis, Routine w reflex microscopic   Bayer DCA Hb A1c Waived     Genitourinary   BPH (benign  prostatic hyperplasia)   Relevant Orders   PSA     Other   Hyperlipidemia   Relevant Medications   benazepril (LOTENSIN) 40 MG tablet   amLODipine (NORVASC) 5 MG tablet   Other Relevant Orders   CBC with Differential/Platelet   Comprehensive metabolic panel   Lipid panel   Urinalysis, Routine w reflex microscopic   Bayer DCA Hb A1c Waived   CBC with Differential/Platelet   Comprehensive metabolic panel   Lipid panel   Urinalysis, Routine w reflex microscopic   Bayer DCA Hb A1c Waived   Failed back syndrome    Stable discussed walking and wt loss      Advanced care planning/counseling discussion    A voluntary discussion about advanced care planning including explanation and discussion of advanced directives was extentively discussed with the patient.  Explained about the healthcare proxy and living will was reviewed and packet with forms with expiration of how to fill them out was given.  Time spent: Encounter 16+ min individuals present: Patient       Other Visit Diagnoses    Annual physical exam       Relevant Orders   CBC with Differential/Platelet   Comprehensive metabolic panel   Lipid panel   PSA   TSH   Urinalysis, Routine w reflex microscopic   Bayer DCA Hb A1c Waived   Prostate cancer screening       Relevant Orders   PSA   Thyroid disorder screen       Relevant Orders   TSH   Essential hypertension, benign       Relevant Medications   benazepril (LOTENSIN) 40 MG tablet   amLODipine (NORVASC) 5 MG tablet   Other Relevant Orders   CBC with Differential/Platelet   Comprehensive metabolic panel   Lipid panel   Urinalysis, Routine w reflex microscopic   Bayer DCA Hb A1c Waived   Need for Tdap vaccination           Follow up plan: Return in about 3 months (around 09/14/2018) for Hemoglobin A1c.

## 2018-06-14 NOTE — Assessment & Plan Note (Signed)
A voluntary discussion about advanced care planning including explanation and discussion of advanced directives was extentively discussed with the patient.  Explained about the healthcare proxy and living will was reviewed and packet with forms with expiration of how to fill them out was given.  Time spent: Encounter 16+ min individuals present: Patient 

## 2018-06-14 NOTE — Assessment & Plan Note (Signed)
Stable discussed walking and wt loss

## 2018-06-15 LAB — CBC WITH DIFFERENTIAL/PLATELET
Basophils Absolute: 0 10*3/uL (ref 0.0–0.2)
Basos: 0 %
EOS (ABSOLUTE): 0.3 10*3/uL (ref 0.0–0.4)
Eos: 4 %
Hematocrit: 46.4 % (ref 37.5–51.0)
Hemoglobin: 15.3 g/dL (ref 13.0–17.7)
IMMATURE GRANULOCYTES: 0 %
Immature Grans (Abs): 0 10*3/uL (ref 0.0–0.1)
Lymphocytes Absolute: 1.8 10*3/uL (ref 0.7–3.1)
Lymphs: 27 %
MCH: 28.2 pg (ref 26.6–33.0)
MCHC: 33 g/dL (ref 31.5–35.7)
MCV: 86 fL (ref 79–97)
MONOS ABS: 0.7 10*3/uL (ref 0.1–0.9)
Monocytes: 10 %
NEUTROS PCT: 59 %
Neutrophils Absolute: 3.8 10*3/uL (ref 1.4–7.0)
Platelets: 166 10*3/uL (ref 150–450)
RBC: 5.42 x10E6/uL (ref 4.14–5.80)
RDW: 13.6 % (ref 12.3–15.4)
WBC: 6.6 10*3/uL (ref 3.4–10.8)

## 2018-06-15 LAB — COMPREHENSIVE METABOLIC PANEL
A/G RATIO: 1.7 (ref 1.2–2.2)
ALT: 18 IU/L (ref 0–44)
AST: 18 IU/L (ref 0–40)
Albumin: 4.6 g/dL (ref 3.5–4.8)
Alkaline Phosphatase: 74 IU/L (ref 39–117)
BUN/Creatinine Ratio: 19 (ref 10–24)
BUN: 20 mg/dL (ref 8–27)
Bilirubin Total: 0.4 mg/dL (ref 0.0–1.2)
CALCIUM: 9.4 mg/dL (ref 8.6–10.2)
CHLORIDE: 100 mmol/L (ref 96–106)
CO2: 20 mmol/L (ref 20–29)
Creatinine, Ser: 1.03 mg/dL (ref 0.76–1.27)
GFR calc Af Amer: 84 mL/min/{1.73_m2} (ref >59)
GFR, EST NON AFRICAN AMERICAN: 72 mL/min/{1.73_m2} (ref >59)
Globulin, Total: 2.7 g/dL (ref 1.5–4.5)
Glucose: 127 mg/dL — ABNORMAL HIGH (ref 65–99)
POTASSIUM: 4.4 mmol/L (ref 3.5–5.2)
Sodium: 136 mmol/L (ref 134–144)
Total Protein: 7.3 g/dL (ref 6.0–8.5)

## 2018-06-15 LAB — LIPID PANEL
CHOL/HDL RATIO: 5.8 ratio — AB (ref 0.0–5.0)
Cholesterol, Total: 227 mg/dL — ABNORMAL HIGH (ref 100–199)
HDL: 39 mg/dL — AB (ref >39)
LDL Calculated: 144 mg/dL — ABNORMAL HIGH (ref 0–99)
Triglycerides: 222 mg/dL — ABNORMAL HIGH (ref 0–149)
VLDL Cholesterol Cal: 44 mg/dL — ABNORMAL HIGH (ref 5–40)

## 2018-06-15 LAB — TSH: TSH: 2.73 u[IU]/mL (ref 0.450–4.500)

## 2018-06-15 LAB — PSA: Prostate Specific Ag, Serum: 0.9 ng/mL (ref 0.0–4.0)

## 2018-06-19 ENCOUNTER — Telehealth: Payer: Self-pay | Admitting: Family Medicine

## 2018-06-19 MED ORDER — ATORVASTATIN CALCIUM 20 MG PO TABS: 20.0000 mg | ORAL_TABLET | Freq: Every day | ORAL | 1 refills | Status: DC

## 2018-06-19 NOTE — Telephone Encounter (Signed)
-----   Message from Richarda OverlieJada A [place], [place] sent at 06/19/2018  4:50 PM EDT ----- Patient was transferred to provider for telephone conversation.

## 2018-06-24 ENCOUNTER — Other Ambulatory Visit: Payer: Self-pay | Admitting: Family Medicine

## 2018-06-24 DIAGNOSIS — E119 Type 2 diabetes mellitus without complications: Secondary | ICD-10-CM

## 2018-06-26 ENCOUNTER — Telehealth: Payer: Self-pay | Admitting: Family Medicine

## 2018-06-26 DIAGNOSIS — E119 Type 2 diabetes mellitus without complications: Secondary | ICD-10-CM

## 2018-06-26 MED ORDER — EMPAGLIFLOZIN 25 MG PO TABS: 25.0000 mg | ORAL_TABLET | Freq: Every day | ORAL | 4 refills | Status: DC

## 2018-06-26 NOTE — Telephone Encounter (Signed)
Medication filled on 06/26/18 

## 2018-06-26 NOTE — Telephone Encounter (Signed)
Copied from CRM [id number]. Topic: Quick Communication - See Telephone Encounter >> Jun 26, 2018  9:20 AM Floria RavelingStovall, Shana A wrote: CRM for notification. See Telephone encounter for: 06/26/18. Pt wife called in and stated that the empagliflozin (JARDIANCE) 25 MG TABS tablet [[id number]][[id number]] [facility] [place].  Can this be resent ?

## 2018-06-26 NOTE — Telephone Encounter (Signed)
Pharmacy is calling to check on the status of this being sent in.

## 2018-10-05 ENCOUNTER — Ambulatory Visit: Payer: Federal, State, Local not specified - PPO | Admitting: Family Medicine

## 2018-10-17 ENCOUNTER — Other Ambulatory Visit: Payer: Self-pay

## 2018-10-17 NOTE — Telephone Encounter (Signed)
PA for Xultpohy submitted and approved via Cover My Meds.

## 2018-12-16 ENCOUNTER — Other Ambulatory Visit: Payer: Self-pay | Admitting: Family Medicine

## 2018-12-18 NOTE — Telephone Encounter (Signed)
Requested Prescriptions  Pending Prescriptions Disp Refills  . atorvastatin (LIPITOR) 20 MG tablet [Pharmacy Med Name: ATORVASTATIN 20 MG TABLET] 90 tablet 1    Sig: TAKE 1 TABLET BY MOUTH EVERY DAY     Cardiovascular:  Antilipid - Statins Failed - 12/16/2018  9:28 AM      Failed - Total Cholesterol in normal range and within 360 days    Cholesterol, Total  Date Value Ref Range Status  06/14/2018 227 (H) 100 - 199 mg/dL Final   Cholesterol Piccolo, Waived  Date Value Ref Range Status  07/30/2015 202 (H) <200 mg/dL Final    Comment:                            Desirable                <200                         Borderline High      200- 239                         High                     >239          Failed - LDL in normal range and within 360 days    LDL Calculated  Date Value Ref Range Status  06/14/2018 144 (H) 0 - 99 mg/dL Final         Failed - HDL in normal range and within 360 days    HDL  Date Value Ref Range Status  06/14/2018 39 (L) >39 mg/dL Final         Failed - Triglycerides in normal range and within 360 days    Triglycerides  Date Value Ref Range Status  06/14/2018 222 (H) 0 - 149 mg/dL Final   Triglycerides Piccolo,Waived  Date Value Ref Range Status  07/30/2015 271 (H) <150 mg/dL Final    Comment:                            Normal                   <150                         Borderline High     150 - 199                         High                200 - 499                         Very High                >499          Passed - Patient is not pregnant      Passed - Valid encounter within last 12 months    Recent Outpatient Visits          6 months ago Essential hypertension   [facility] Crissman, Redge GainerMark A, MD   1 year ago Diabetes mellitus without complication (HCC)   [facility]  Steele Sizer, MD   1 year ago Diabetes mellitus without complication [facility])   [facility] Crissman, Redge Gainer, MD   1  year ago Annual physical exam   [facility] Steele Sizer, MD   2 years ago Diabetes mellitus without complication [facility])   [facility] Crissman, Redge Gainer, MD

## 2019-05-24 ENCOUNTER — Other Ambulatory Visit: Payer: Self-pay | Admitting: Family Medicine

## 2019-05-24 NOTE — Telephone Encounter (Signed)
Forwarding to provider for review. No current A1c or f/u ov since 06/2018.

## 2019-06-30 ENCOUNTER — Other Ambulatory Visit: Payer: Self-pay | Admitting: Family Medicine

## 2019-07-01 NOTE — Telephone Encounter (Signed)
Requested Prescriptions  Pending Prescriptions Disp Refills  . atorvastatin (LIPITOR) 20 MG tablet [Pharmacy Med Name: ATORVASTATIN 20 MG TABLET] 30 tablet 0    Sig: TAKE 1 TABLET BY MOUTH EVERY DAY     Cardiovascular:  Antilipid - Statins Failed - 06/30/2019  9:03 AM      Failed - Total Cholesterol in normal range and within 360 days    Cholesterol, Total  Date Value Ref Range Status  06/14/2018 227 (H) 100 - 199 mg/dL Final   Cholesterol Piccolo, Waived  Date Value Ref Range Status  07/30/2015 202 (H) <200 mg/dL Final    Comment:                            Desirable                <200                         Borderline High      200- 239                         High                     >239          Failed - LDL in normal range and within 360 days    LDL Calculated  Date Value Ref Range Status  06/14/2018 144 (H) 0 - 99 mg/dL Final         Failed - HDL in normal range and within 360 days    HDL  Date Value Ref Range Status  06/14/2018 39 (L) >39 mg/dL Final         Failed - Triglycerides in normal range and within 360 days    Triglycerides  Date Value Ref Range Status  06/14/2018 222 (H) 0 - 149 mg/dL Final   Triglycerides Piccolo,Waived  Date Value Ref Range Status  07/30/2015 271 (H) <150 mg/dL Final    Comment:                            Normal                   <150                         Borderline High     150 - 199                         High                200 - 499                         Very High                >499          Failed - Valid encounter within last 12 months    Recent Outpatient Visits          1 year ago Essential hypertension   [facility] Crissman, Redge GainerMark A, MD   1 year ago Diabetes mellitus without complication [facility](HCC)   [facility] Crissman, Redge GainerMark A, MD   2 years ago Diabetes mellitus  without complication [facility])   [facility] Crissman, Jeannette How, MD   2 years ago Annual physical exam   [facility] Guadalupe Maple, MD   2 years ago Diabetes mellitus without complication [facility])   [place], [place]             Passed - Patient is not pregnant

## 2019-07-22 ENCOUNTER — Other Ambulatory Visit: Payer: Self-pay | Admitting: Family Medicine

## 2019-07-22 DIAGNOSIS — I10 Essential (primary) hypertension: Secondary | ICD-10-CM

## 2019-07-22 DIAGNOSIS — E119 Type 2 diabetes mellitus without complications: Secondary | ICD-10-CM

## 2019-07-22 NOTE — Telephone Encounter (Signed)
Requested medication (s) are due for refill today: yes  Requested medication (s) are on the active medication list: yes  Last refill:  06/14/18  Future visit scheduled: no  Notes to clinic:  > 3 months overdue for appt.   Requested Prescriptions  Pending Prescriptions Disp Refills   benazepril (LOTENSIN) 40 MG tablet [Pharmacy Med Name: BENAZEPRIL HCL 40 MG TABLET] 90 tablet 4    Sig: TAKE 1 TABLET BY MOUTH EVERY DAY     Cardiovascular:  ACE Inhibitors Failed - 07/22/2019  9:33 AM      Failed - Cr in normal range and within 180 days    Creatinine  Date Value Ref Range Status  07/09/2014 0.96 0.60 - 1.30 mg/dL Final   Creatinine, Ser  Date Value Ref Range Status  06/14/2018 1.03 0.76 - 1.27 mg/dL Final         Failed - K in normal range and within 180 days    Potassium  Date Value Ref Range Status  06/14/2018 4.4 3.5 - 5.2 mmol/L Final  07/09/2014 3.7 3.5 - 5.1 mmol/L Final         Failed - Valid encounter within last 6 months    Recent Outpatient Visits          1 year ago Essential hypertension   [facility] Crissman, Redge GainerMark A, MD   1 year ago Diabetes mellitus without complication (HCC)   [facility] Crissman, Redge GainerMark A, MD   2 years ago Diabetes mellitus without complication (HCC)   [facility] Crissman, Redge GainerMark A, MD   2 years ago Annual physical exam   [facility] Crissman, Redge GainerMark A, MD   2 years ago Diabetes mellitus without complication (HCC)   [facility] Crissman, Redge GainerMark A, MD             Passed - Patient is not pregnant      Passed - Last BP in normal range    BP Readings from Last 1 Encounters:  06/14/18 129/77          glipiZIDE (GLUCOTROL) 5 MG tablet [Pharmacy Med Name: GLIPIZIDE 5 MG TABLET] 90 tablet 4    Sig: TAKE 1 TABLET BY MOUTH EVERY DAY     Endocrinology:  Diabetes - Sulfonylureas Failed - 07/22/2019  9:33 AM      Failed - HBA1C is between 0 and 7.9 and within 180 days    HB A1C  (BAYER DCA - WAIVED)  Date Value Ref Range Status  06/14/2018 6.6 <7.0 % Final    Comment:                                          Diabetic Adult            <7.0                                       Healthy Adult        4.3 - 5.7                                                           (  DCCT/NGSP) American Diabetes Association's Summary of Glycemic Recommendations for Adults with Diabetes: Hemoglobin A1c <7.0%. More stringent glycemic goals (A1c <6.0%) may further reduce complications at the cost of increased risk of hypoglycemia.          Failed - Valid encounter within last 6 months    Recent Outpatient Visits          1 year ago Essential hypertension   [facility] Crissman, Jeannette How, MD   1 year ago Diabetes mellitus without complication [facility])   [facility] Crissman, Jeannette How, MD   2 years ago Diabetes mellitus without complication [facility])   [facility] Crissman, Jeannette How, MD   2 years ago Annual physical exam   [facility] Guadalupe Maple, MD   2 years ago Diabetes mellitus without complication [facility])   [facility] Crissman, Jeannette How, MD

## 2019-07-23 NOTE — Telephone Encounter (Signed)
Routing to provider for refills if approved. Patient last seen 06/14/18, please route back to schedule appointment.

## 2019-08-02 ENCOUNTER — Other Ambulatory Visit: Payer: Self-pay | Admitting: Family Medicine

## 2019-08-02 NOTE — Telephone Encounter (Signed)
Called pt to schedule appt, no answer, left vm to call back to schedule

## 2019-08-02 NOTE — Telephone Encounter (Signed)
Spoke with patient wife and advise her that patient needs to call and make a appointment for medication refills with Dr. Golden Pop.    Last ov 06/24/2018  Requested Prescriptions  Pending Prescriptions Disp Refills  . atorvastatin (LIPITOR) 20 MG tablet [Pharmacy Med Name: ATORVASTATIN 20 MG TABLET] 30 tablet 0    Sig: TAKE 1 TABLET BY MOUTH EVERY DAY     Cardiovascular:  Antilipid - Statins Failed - 08/02/2019 12:30 PM      Failed - Total Cholesterol in normal range and within 360 days    Cholesterol, Total  Date Value Ref Range Status  06/14/2018 227 (H) 100 - 199 mg/dL Final   Cholesterol Piccolo, Waived  Date Value Ref Range Status  07/30/2015 202 (H) <200 mg/dL Final    Comment:                            Desirable                <200                         Borderline High      200- 239                         High                     >239          Failed - LDL in normal range and within 360 days    LDL Calculated  Date Value Ref Range Status  06/14/2018 144 (H) 0 - 99 mg/dL Final         Failed - HDL in normal range and within 360 days    HDL  Date Value Ref Range Status  06/14/2018 39 (L) >39 mg/dL Final         Failed - Triglycerides in normal range and within 360 days    Triglycerides  Date Value Ref Range Status  06/14/2018 222 (H) 0 - 149 mg/dL Final   Triglycerides Piccolo,Waived  Date Value Ref Range Status  07/30/2015 271 (H) <150 mg/dL Final    Comment:                            Normal                   <150                         Borderline High     150 - 199                         High                200 - 499                         Very High                >499          Failed - Valid encounter within last 12 months    Recent Outpatient Visits          1 year ago Essential hypertension   [place],  Redge GainerMark A, MD   1 year ago Diabetes mellitus without complication [facility](HCC)   [facility] Crissman, Redge GainerMark  A, MD   2 years ago Diabetes mellitus without complication [facility](HCC)   [facility] Crissman, Redge GainerMark A, MD   2 years ago Annual physical exam   [facility] Steele Siz[facility], Mark A, MD   2 years ago Diabetes mellitus without complication [facility](HCC)   [facility] Crissman, Redge GainerMark A, MD             Passed - Patient is not pregnant

## 2019-08-16 ENCOUNTER — Other Ambulatory Visit: Payer: Self-pay | Admitting: Family Medicine

## 2019-08-30 ENCOUNTER — Other Ambulatory Visit: Payer: Self-pay | Admitting: Family Medicine

## 2019-08-30 DIAGNOSIS — E119 Type 2 diabetes mellitus without complications: Secondary | ICD-10-CM

## 2019-08-30 NOTE — Telephone Encounter (Signed)
Requested medication (s) are due for refill today: yes  Requested medication (s) are on the active medication list: yes  Last refill:  06/06/2019  Future visit scheduled: no  Notes to clinic:  Review for refill   Requested Prescriptions  Pending Prescriptions Disp [place] 100-3.6 UNIT-MG/ML SOPN [Pharmacy Med Name: XULTOPHY 100 UNIT-3.6MG /ML PEN] 30 mL 0    Sig: Inject 20 Units into the skin every morning. Inject sliding scale from 20-34 units daily     There is no refill protocol information for this order

## 2019-09-06 ENCOUNTER — Other Ambulatory Visit: Payer: Self-pay | Admitting: Family Medicine

## 2019-09-06 DIAGNOSIS — E119 Type 2 diabetes mellitus without complications: Secondary | ICD-10-CM

## 2019-09-06 NOTE — Telephone Encounter (Signed)
Forwarding medication refill request to PCP for review. 

## 2019-09-07 ENCOUNTER — Other Ambulatory Visit: Payer: Self-pay | Admitting: Family Medicine

## 2019-09-07 DIAGNOSIS — I10 Essential (primary) hypertension: Secondary | ICD-10-CM

## 2019-09-07 DIAGNOSIS — E119 Type 2 diabetes mellitus without complications: Secondary | ICD-10-CM

## 2019-09-07 NOTE — Telephone Encounter (Signed)
Routing to provider  

## 2019-09-07 NOTE — Telephone Encounter (Signed)
Requested medication (s) are due for refill today: yes  Requested medication (s) are on the active medication list:yes  Last refill:  06/06/2019  Future visit scheduled:no  Notes to clinic:  Review for refill   Requested Prescriptions  Pending Prescriptions Disp Refills   metFORMIN (GLUCOPHAGE) 500 MG tablet [Pharmacy Med Name: METFORMIN HCL 500 MG TABLET] 360 tablet 4    Sig: Take 2 tablets (1,000 mg total) by mouth 2 (two) times daily with a meal.     Endocrinology:  Diabetes - Biguanides Failed - 09/07/2019  1:30 AM      Failed - Cr in normal range and within 360 days    Creatinine  Date Value Ref Range Status  07/09/2014 0.96 0.60 - 1.30 mg/dL Final   Creatinine, Ser  Date Value Ref Range Status  06/14/2018 1.03 0.76 - 1.27 mg/dL Final         Failed - HBA1C is between 0 and 7.9 and within 180 days    HB A1C (BAYER DCA - WAIVED)  Date Value Ref Range Status  06/14/2018 6.6 <7.0 % Final    Comment:                                          Diabetic Adult            <7.0                                       Healthy Adult        4.3 - 5.7                                                           (DCCT/NGSP) American Diabetes Association's Summary of Glycemic Recommendations for Adults with Diabetes: Hemoglobin A1c <7.0%. More stringent glycemic goals (A1c <6.0%) may further reduce complications at the cost of increased risk of hypoglycemia.          Failed - eGFR in normal range and within 360 days    EGFR (African American)  Date Value Ref Range Status  07/09/2014 >60  Final   GFR calc Af Amer  Date Value Ref Range Status  06/14/2018 84 >59 mL/min/1.73 Final   EGFR (Non-African Amer.)  Date Value Ref Range Status  07/09/2014 >60  Final    Comment:    eGFR values <36m/min/1.73 m2 may be an indication of chronic kidney disease (CKD). Calculated eGFR is useful in patients with stable renal function. The eGFR calculation will not be reliable in acutely ill  patients when serum creatinine is changing rapidly. It is not useful in  patients on dialysis. The eGFR calculation may not be applicable to patients at the low and high extremes of body sizes, pregnant women, and vegetarians.    GFR calc non Af Amer  Date Value Ref Range Status  06/14/2018 72 >59 mL/min/1.73 Final         Failed - Valid encounter within last 6 months    Recent Outpatient Visits          1 year ago Essential hypertension   [place]  Jeannette How, MD   1 year ago Diabetes mellitus without complication [facility])   [facility] Crissman, Jeannette How, MD   2 years ago Diabetes mellitus without complication [facility])   [facility] Crissman, Jeannette How, MD   2 years ago Annual physical exam   [facility] Guadalupe Maple, MD   3 years ago Diabetes mellitus without complication ([place])   [place], Mark A, MD              amLODipine (NORVASC) 5 MG tablet [Pharmacy Med Name: AMLODIPINE BESYLATE 5 MG TAB] 90 tablet 4    Sig: TAKE 1 TABLET BY MOUTH EVERY DAY     Cardiovascular:  Calcium Channel Blockers Failed - 09/07/2019  1:30 AM      Failed - Valid encounter within last 6 months    Recent Outpatient Visits          1 year ago Essential hypertension   [facility] Crissman, Jeannette How, MD   1 year ago Diabetes mellitus without complication ([place])   [facility] Crissman, Jeannette How, MD   2 years ago Diabetes mellitus without complication ([place])   [facility] Crissman, Jeannette How, MD   2 years ago Annual physical exam   [facility] Guadalupe Maple, MD   3 years ago Diabetes mellitus without complication [facility])   [place], MD             Passed - Last BP in normal range    BP Readings from Last 1 Encounters:  06/14/18 129/77

## 2019-10-15 ENCOUNTER — Telehealth: Payer: Self-pay | Admitting: Family Medicine

## 2019-10-15 NOTE — Telephone Encounter (Signed)
PA approved via cover my meds

## 2019-10-15 NOTE — Telephone Encounter (Signed)
Pt's spouse calling.  Pt got a letter from his insurance company stating that [place] 100-3.6 UNIT-MG/ML SOPN needs prior authorization.  Prior authorization expires on the 4th.

## 2020-06-14 ENCOUNTER — Emergency Department: Payer: Medicare Other

## 2020-06-14 ENCOUNTER — Inpatient Hospital Stay
Admission: EM | Admit: 2020-06-14 | Discharge: 2020-07-13 | DRG: 208 | Disposition: E | Payer: Medicare Other | Attending: Pulmonary Disease | Admitting: Pulmonary Disease

## 2020-06-14 ENCOUNTER — Other Ambulatory Visit: Payer: Self-pay

## 2020-06-14 DIAGNOSIS — X000XXA Exposure to flames in uncontrolled fire in building or structure, initial encounter: Secondary | ICD-10-CM | POA: Diagnosis present

## 2020-06-14 DIAGNOSIS — I468 Cardiac arrest due to other underlying condition: Secondary | ICD-10-CM | POA: Diagnosis present

## 2020-06-14 DIAGNOSIS — Z20822 Contact with and (suspected) exposure to covid-19: Secondary | ICD-10-CM | POA: Diagnosis present

## 2020-06-14 DIAGNOSIS — Z66 Do not resuscitate: Secondary | ICD-10-CM | POA: Diagnosis not present

## 2020-06-14 DIAGNOSIS — I4901 Ventricular fibrillation: Secondary | ICD-10-CM | POA: Diagnosis present

## 2020-06-14 DIAGNOSIS — T5891XA Toxic effect of carbon monoxide from unspecified source, accidental (unintentional), initial encounter: Secondary | ICD-10-CM

## 2020-06-14 DIAGNOSIS — T59811A Toxic effect of smoke, accidental (unintentional), initial encounter: Secondary | ICD-10-CM | POA: Diagnosis present

## 2020-06-14 DIAGNOSIS — J705 Respiratory conditions due to smoke inhalation: Secondary | ICD-10-CM | POA: Diagnosis present

## 2020-06-14 DIAGNOSIS — N179 Acute kidney failure, unspecified: Secondary | ICD-10-CM | POA: Diagnosis present

## 2020-06-14 DIAGNOSIS — I469 Cardiac arrest, cause unspecified: Secondary | ICD-10-CM

## 2020-06-14 DIAGNOSIS — J9602 Acute respiratory failure with hypercapnia: Secondary | ICD-10-CM | POA: Diagnosis present

## 2020-06-14 DIAGNOSIS — K7200 Acute and subacute hepatic failure without coma: Secondary | ICD-10-CM | POA: Diagnosis present

## 2020-06-14 DIAGNOSIS — J939 Pneumothorax, unspecified: Secondary | ICD-10-CM

## 2020-06-14 DIAGNOSIS — J9601 Acute respiratory failure with hypoxia: Secondary | ICD-10-CM | POA: Diagnosis present

## 2020-06-14 DIAGNOSIS — E872 Acidosis: Secondary | ICD-10-CM | POA: Diagnosis present

## 2020-06-14 LAB — CBC WITH DIFFERENTIAL/PLATELET
Abs Immature Granulocytes: 0.66 10*3/uL — ABNORMAL HIGH (ref 0.00–0.07)
Basophils Absolute: 0.1 10*3/uL (ref 0.0–0.1)
Basophils Relative: 1 %
Eosinophils Absolute: 0.3 10*3/uL (ref 0.0–0.5)
Eosinophils Relative: 3 %
HCT: 44.5 % (ref 39.0–52.0)
Hemoglobin: 13.5 g/dL (ref 13.0–17.0)
Immature Granulocytes: 8 %
Lymphocytes Relative: 59 %
Lymphs Abs: 4.9 10*3/uL — ABNORMAL HIGH (ref 0.7–4.0)
MCH: 30.4 pg (ref 26.0–34.0)
MCHC: 30.3 g/dL (ref 30.0–36.0)
MCV: 100.2 fL — ABNORMAL HIGH (ref 80.0–100.0)
Monocytes Absolute: 0.6 10*3/uL (ref 0.1–1.0)
Monocytes Relative: 7 %
Neutro Abs: 1.9 10*3/uL (ref 1.7–7.7)
Neutrophils Relative %: 22 %
Platelets: 61 10*3/uL — ABNORMAL LOW (ref 150–400)
RBC: 4.44 MIL/uL (ref 4.22–5.81)
RDW: 12.9 % (ref 11.5–15.5)
WBC: 8.3 10*3/uL (ref 4.0–10.5)
nRBC: 0.4 % — ABNORMAL HIGH (ref 0.0–0.2)

## 2020-06-14 LAB — SARS CORONAVIRUS 2 BY RT PCR (HOSPITAL ORDER, PERFORMED IN ~~LOC~~ HOSPITAL LAB): SARS Coronavirus 2: NEGATIVE

## 2020-06-14 LAB — COMPREHENSIVE METABOLIC PANEL
ALT: 101 U/L — ABNORMAL HIGH (ref 0–44)
AST: 106 U/L — ABNORMAL HIGH (ref 15–41)
Albumin: 3.1 g/dL — ABNORMAL LOW (ref 3.5–5.0)
Alkaline Phosphatase: 91 U/L (ref 38–126)
Anion gap: 27 — ABNORMAL HIGH (ref 5–15)
BUN: 33 mg/dL — ABNORMAL HIGH (ref 8–23)
CO2: 15 mmol/L — ABNORMAL LOW (ref 22–32)
Calcium: 10.3 mg/dL (ref 8.9–10.3)
Chloride: 101 mmol/L (ref 98–111)
Creatinine, Ser: 1.83 mg/dL — ABNORMAL HIGH (ref 0.61–1.24)
GFR calc Af Amer: 41 mL/min — ABNORMAL LOW (ref >60)
GFR calc non Af Amer: 36 mL/min — ABNORMAL LOW (ref >60)
Glucose, Bld: 666 mg/dL (ref 70–99)
Potassium: 5.4 mmol/L — ABNORMAL HIGH (ref 3.5–5.1)
Sodium: 143 mmol/L (ref 135–145)
Total Bilirubin: 0.6 mg/dL (ref 0.3–1.2)
Total Protein: 6.2 g/dL — ABNORMAL LOW (ref 6.5–8.1)

## 2020-06-14 LAB — LACTIC ACID, PLASMA: Lactic Acid, Venous: >11 mmol/L (ref 0.5–1.9)

## 2020-06-14 MED ORDER — SODIUM BICARBONATE 8.4 % IV SOLN
50.0000 meq | Freq: Once | INTRAVENOUS | Status: AC
  Administered 2020-06-14: 50 meq via INTRAVENOUS

## 2020-06-14 MED ORDER — INSULIN REGULAR(HUMAN) IN NACL 100-0.9 UT/100ML-% IV SOLN
INTRAVENOUS | Status: DC
  Administered 2020-06-15: 17 [IU]/h via INTRAVENOUS
  Filled 2020-06-14: qty 100

## 2020-06-14 MED ORDER — DOPAMINE-DEXTROSE 3.2-5 MG/ML-% IV SOLN
0.0000 ug/kg/min | INTRAVENOUS | Status: DC
  Administered 2020-06-14: 5 ug/kg/min via INTRAVENOUS

## 2020-06-14 MED ORDER — EPINEPHRINE 1 MG/10ML IJ SOSY
PREFILLED_SYRINGE | INTRAMUSCULAR | Status: AC
  Filled 2020-06-14: qty 30

## 2020-06-14 MED ORDER — PANTOPRAZOLE SODIUM 40 MG IV SOLR
40.0000 mg | Freq: Two times a day (BID) | INTRAVENOUS | Status: DC
  Administered 2020-06-15: 40 mg via INTRAVENOUS
  Filled 2020-06-14: qty 40

## 2020-06-14 MED ORDER — DEXTROSE 50 % IV SOLN: 0.0000 mL | INTRAVENOUS | Status: DC | PRN

## 2020-06-14 MED ORDER — SODIUM CHLORIDE 0.9 % IV SOLN: INTRAVENOUS | Status: DC

## 2020-06-14 MED ORDER — DOBUTAMINE IN D5W 4-5 MG/ML-% IV SOLN
2.5000 ug/kg/min | INTRAVENOUS | Status: DC
  Administered 2020-06-14: 2.5 ug/kg/min via INTRAVENOUS
  Filled 2020-06-14: qty 250

## 2020-06-14 MED ORDER — ACETYLCYSTEINE 20 % IN SOLN
3.0000 mL | Freq: Once | RESPIRATORY_TRACT | Status: AC
  Administered 2020-06-14: 3 mL via RESPIRATORY_TRACT
  Filled 2020-06-14: qty 4

## 2020-06-14 MED ORDER — ACETYLCYSTEINE 20 % IN SOLN
4.0000 mL | Freq: Once | RESPIRATORY_TRACT | Status: DC
  Filled 2020-06-14: qty 4

## 2020-06-14 MED ORDER — NOREPINEPHRINE 4 MG/250ML-% IV SOLN
0.0000 ug/min | INTRAVENOUS | Status: DC
  Administered 2020-06-14: 40 ug/min via INTRAVENOUS
  Filled 2020-06-14: qty 250

## 2020-06-14 MED ORDER — VASOPRESSIN 20 UNIT/ML IV SOLN
0.0300 [IU]/min | INTRAVENOUS | Status: DC
  Administered 2020-06-14: 0.03 [IU]/min via INTRAVENOUS
  Filled 2020-06-14: qty 2

## 2020-06-14 MED ORDER — STERILE WATER FOR INJECTION IV SOLN
INTRAVENOUS | Status: DC
  Filled 2020-06-14: qty 850
  Filled 2020-06-14: qty 150
  Filled 2020-06-14: qty 850

## 2020-06-14 MED ORDER — DEXTROSE-NACL 5-0.45 % IV SOLN: INTRAVENOUS | Status: DC

## 2020-06-14 MED ORDER — IPRATROPIUM-ALBUTEROL 0.5-2.5 (3) MG/3ML IN SOLN
9.0000 mL | Freq: Once | RESPIRATORY_TRACT | Status: AC
  Administered 2020-06-14: 9 mL via RESPIRATORY_TRACT

## 2020-06-14 MED ORDER — EPINEPHRINE HCL 5 MG/250ML IV SOLN IN NS
0.5000 ug/min | INTRAVENOUS | Status: DC
  Administered 2020-06-14: 0.5 ug/min via INTRAVENOUS
  Administered 2020-06-15: 20 ug/min via INTRAVENOUS
  Filled 2020-06-14: qty 250

## 2020-06-14 NOTE — ED Notes (Signed)
1 amp bicarb given

## 2020-06-14 NOTE — ED Notes (Signed)
Epi given

## 2020-06-14 NOTE — ED Notes (Signed)
Pulse lost. CPR resumed. 1 epi given

## 2020-06-14 NOTE — ED Notes (Signed)
ET tube inserted by Dr. Derrill Kay. 7.5 Fr with positive color change

## 2020-06-14 NOTE — ED Notes (Addendum)
Pulse felt by Dr. Derrill Kay. Wide complex tachycardia

## 2020-06-14 NOTE — ED Notes (Signed)
Shock administered at 200J 
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
## 2020-06-14 NOTE — ED Notes (Signed)
Pulse check vfib. 200J shock given

## 2020-06-14 NOTE — ED Notes (Signed)
Pt pulse decreased to 35. cpr resumed

## 2020-06-14 NOTE — ED Notes (Addendum)
Pulse check asystole resuming cpr.

## 2020-06-14 NOTE — ED Notes (Signed)
Epi given and pulse check shows asystole cpr resumed

## 2020-06-14 NOTE — ED Notes (Signed)
Pulse found. CPR paused.

## 2020-06-14 NOTE — ED Notes (Signed)
Pulse check asystole

## 2020-06-14 NOTE — ED Notes (Signed)
OG tube inserted at this time 16Fr

## 2020-06-14 NOTE — ED Notes (Signed)
Pt's upper dentures brought in by EMS and placed into labeled denture cup by this RN.

## 2020-06-14 NOTE — ED Notes (Signed)
Pt brady down to 50. 1 epi given and cpr resumed

## 2020-06-14 NOTE — ED Notes (Signed)
Pt with pulse at 130. cpr paused

## 2020-06-14 NOTE — ED Notes (Signed)
1 epi and 1 atropine given.  

## 2020-06-14 NOTE — ED Notes (Signed)
cpr paused due to pulse present at 125

## 2020-06-14 NOTE — ED Notes (Signed)
LR started through 18G left [facility]
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
## 2020-06-15 ENCOUNTER — Inpatient Hospital Stay: Payer: Medicare Other

## 2020-06-15 DIAGNOSIS — I469 Cardiac arrest, cause unspecified: Secondary | ICD-10-CM

## 2020-06-15 LAB — BASIC METABOLIC PANEL
Anion gap: 21 — ABNORMAL HIGH (ref 5–15)
BUN: 36 mg/dL — ABNORMAL HIGH (ref 8–23)
CO2: 22 mmol/L (ref 22–32)
Calcium: 8.5 mg/dL — ABNORMAL LOW (ref 8.9–10.3)
Chloride: 97 mmol/L — ABNORMAL LOW (ref 98–111)
Creatinine, Ser: 2.65 mg/dL — ABNORMAL HIGH (ref 0.61–1.24)
GFR calc Af Amer: 26 mL/min — ABNORMAL LOW (ref >60)
GFR calc non Af Amer: 23 mL/min — ABNORMAL LOW (ref >60)
Glucose, Bld: 840 mg/dL (ref 70–99)
Potassium: 4.5 mmol/L (ref 3.5–5.1)
Sodium: 140 mmol/L (ref 135–145)

## 2020-06-15 LAB — URINE DRUG SCREEN, QUALITATIVE (ARMC ONLY)
Amphetamines, Ur Screen: NOT DETECTED
Barbiturates, Ur Screen: NOT DETECTED
Benzodiazepine, Ur Scrn: NOT DETECTED
Cannabinoid 50 Ng, Ur ~~LOC~~: NOT DETECTED
Cocaine Metabolite,Ur ~~LOC~~: NOT DETECTED
MDMA (Ecstasy)Ur Screen: NOT DETECTED
Methadone Scn, Ur: NOT DETECTED
Opiate, Ur Screen: NOT DETECTED
Phencyclidine (PCP) Ur S: NOT DETECTED
Tricyclic, Ur Screen: NOT DETECTED

## 2020-06-15 LAB — URINALYSIS, COMPLETE (UACMP) WITH MICROSCOPIC
Bacteria, UA: NONE SEEN
Bilirubin Urine: NEGATIVE
Glucose, UA: >=500 mg/dL — AB
Hgb urine dipstick: NEGATIVE
Ketones, ur: NEGATIVE mg/dL
Leukocytes,Ua: NEGATIVE
Nitrite: NEGATIVE
Protein, ur: NEGATIVE mg/dL
Specific Gravity, Urine: 1.026 (ref 1.005–1.030)
Squamous Epithelial / HPF: NONE SEEN (ref 0–5)
pH: 5 (ref 5.0–8.0)

## 2020-06-15 LAB — TROPONIN I (HIGH SENSITIVITY)
Troponin I (High Sensitivity): 42 ng/L — ABNORMAL HIGH (ref <18)
Troponin I (High Sensitivity): 3101 ng/L (ref <18)

## 2020-06-15 LAB — BLOOD GAS, ARTERIAL
Acid-base deficit: 13.9 mmol/L — ABNORMAL HIGH (ref 0.0–2.0)
Bicarbonate: 18.2 mmol/L — ABNORMAL LOW (ref 20.0–28.0)
FIO2: 1
MECHVT: 500 mL
Mechanical Rate: 20
O2 Saturation: 42 %
PEEP: 10 cmH2O
Patient temperature: 37
pCO2 arterial: 74 mmHg (ref 32.0–48.0)
pH, Arterial: 7 — CL (ref 7.350–7.450)
pO2, Arterial: 38 mmHg — CL (ref 83.0–108.0)

## 2020-06-15 LAB — APTT: aPTT: >160 seconds (ref 24–36)

## 2020-06-15 LAB — PROTIME-INR
INR: >10 (ref 0.8–1.2)
Prothrombin Time: >90 seconds — ABNORMAL HIGH (ref 11.4–15.2)

## 2020-06-15 LAB — HEMOGLOBIN A1C
Hgb A1c MFr Bld: 9.4 % — ABNORMAL HIGH (ref 4.8–5.6)
Mean Plasma Glucose: 223.08 mg/dL

## 2020-06-15 LAB — LACTIC ACID, PLASMA: Lactic Acid, Venous: >11 mmol/L (ref 0.5–1.9)

## 2020-06-15 LAB — PROCALCITONIN: Procalcitonin: <0.1 ng/mL

## 2020-06-15 MED ORDER — DEXTROSE IN LACTATED RINGERS 5 % IV SOLN: INTRAVENOUS | Status: DC

## 2020-06-15 MED ORDER — IPRATROPIUM-ALBUTEROL 0.5-2.5 (3) MG/3ML IN SOLN: 3.0000 mL | RESPIRATORY_TRACT | Status: DC | PRN

## 2020-06-15 MED ORDER — LACTATED RINGERS IV SOLN: INTRAVENOUS | Status: DC

## 2020-06-15 MED ORDER — NOREPINEPHRINE 16 MG/250ML-% IV SOLN
0.0000 ug/min | INTRAVENOUS | Status: DC
  Administered 2020-06-15: 40 ug/min via INTRAVENOUS
  Filled 2020-06-15: qty 250

## 2020-06-15 MED ORDER — CHLORHEXIDINE GLUCONATE CLOTH 2 % EX PADS: 6.0000 | MEDICATED_PAD | Freq: Every day | CUTANEOUS | Status: DC

## 2020-06-15 MED ORDER — HYDROCORTISONE NA SUCCINATE PF 100 MG IJ SOLR
50.0000 mg | Freq: Four times a day (QID) | INTRAMUSCULAR | Status: DC
  Administered 2020-06-15: 50 mg via INTRAVENOUS
  Filled 2020-06-15: qty 2

## 2020-06-15 MED FILL — Medication: Qty: 1 | Status: AC

## 2020-06-16 LAB — URINE CULTURE: Culture: NO GROWTH

## 2020-06-16 LAB — GLUCOSE, CAPILLARY: Glucose-Capillary: >600 mg/dL (ref 70–99)

## 2020-06-17 ENCOUNTER — Encounter: Payer: Self-pay | Admitting: Family Medicine

## 2020-06-18 LAB — BLOOD GAS, ARTERIAL
FIO2: 1
MECHVT: 500 mL
Mechanical Rate: 16
PEEP: 5 cmH2O
Patient temperature: 37
RATE: 16 resp/min
pCO2 arterial: 98 mmHg (ref 32.0–48.0)
pH, Arterial: <6.9 — CL (ref 7.350–7.450)
pO2, Arterial: 144 mmHg — ABNORMAL HIGH (ref 83.0–108.0)

## 2020-06-18 LAB — COOXEMETRY PANEL
Carboxyhemoglobin: 36.1 % (ref 0.5–1.5)
Methemoglobin: 0.3 % (ref 0.0–1.5)
Total oxygen content: 63.2 mL/dL

## 2020-07-13 NOTE — Procedures (Signed)
Central Venous Catheter Insertion Procedure Note  Edward Preston  [id number]  Jan 22, 1946  Date:Jul 01, 2020  Time:12:57 AM   Provider Performing:Reathel Turi D Elvina Sidle   Procedure: Insertion of Non-tunneled Central Venous Catheter(36556) with US guidance (16606)   Indication(s) Medication administration and Difficult access  Consent Unable to obtain consent due to emergent nature of procedure.  Anesthesia Topical only with 1% lidocaine   Timeout Verified patient identification, verified procedure, site/side was marked, verified correct patient position, special equipment/implants available, medications/allergies/relevant history reviewed, required imaging and test results available.  Sterile Technique Maximal sterile technique including full sterile barrier drape, hand hygiene, sterile gown, sterile gloves, mask, hair covering, sterile ultrasound probe cover (if used).  Procedure Description Area of catheter insertion was cleaned with chlorhexidine and draped in sterile fashion.  With real-time ultrasound guidance a central venous catheter was placed into the left femoral vein. Nonpulsatile blood flow and easy flushing noted in all ports.  The catheter was sutured in place and sterile dressing applied.  Complications/Tolerance None; patient tolerated the procedure well. Chest X-ray is ordered to verify placement for internal jugular or subclavian cannulation.   Chest x-ray is not ordered for femoral cannulation.  EBL Minimal  Specimen(s) None    Line was secured at the 20 cm mark.  BIOPATCH applied to the insertion site.     [name], AGACNP-BC [place] [facility] Pager: [phone]
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
## 2020-07-13 NOTE — Progress Notes (Addendum)
Ch arrived at ED in response to Code Blue. Care team was working on Pt. Ch waited until Pt's wife arrived, to provide support. Pt's Renato Gails and wife joined, and later the daughter, son-in-law, and son of Pt. Ch stayed with family to provide ministry of presence. Ch will follow up again as and when needed.

## 2020-07-13 NOTE — Progress Notes (Signed)
Ch visited again with family in ED family consult room and escorted them up to the ICU waiting area. Pt's wife Edward Preston asked for a drink and some crackers. Ch brought some sodas, and crackers for the family. Ch also brought a couple prayer shawls and some heated blankets for the family while they wait. Edward Preston requested prayer before Edward Preston was leaving. CH prayed with them. Pt's daughter Edward Preston was tearful after prayer and said "you can see that I am daddy's girl." Family is staying strong and holding on to hope. Ch let family know to request Ch again if support is needed. Family grateful for visit.

## 2020-07-13 NOTE — Progress Notes (Signed)
Pt. Transported to CCU from ED on vent. Without incident.
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
## 2020-07-13 NOTE — Progress Notes (Signed)
Pt arrived from ED with multiple drips running. Medications titrated as in MAR. Family to waiting room. Pt became bradycardic, unable to doppler pulses. CPR initiated, meds as noted on code sheet. Family at bedside, wife requested we stop CPR and made pt a DNR. She requested to keep current therapies going until pt passed. Pulses lost and pt pronouced at 0255. Chaplain at bedside.

## 2020-07-13 NOTE — Significant Event (Signed)
01:50 Pt with bradycardia (HR 30's) with very weak and thready pulse.  Pt given 1 epi and atropine with improvement in HR to 60's and improvement in palpated pulse.  Discussed with pt's wife, son, and daughter regarding high likelihood that pt will cardiac arrest at some point in the near future.  They want to discuss CODE STATUS amongst themselves, and will get back to staff.  For now pt remains Full Code.  02:25 Pt with bradycardia which progressed to PEA.  CPR  and ACLS protocol initiated.  Pt received several rounds of Epi, multiple amps Bicarb, 1g Calcium, 1g Atropine.  Pt remained in PEA, therefore no shocks delivered.    02:31 Pt's wife, son, and daughter at bedside during code.  Pt's family is in agreement to stop resuscitative measures and to make pt DNR/DNI.         [name], AGACNP-BC [place] [facility] Pager: [phone]

## 2020-07-13 NOTE — Procedures (Signed)
Central Venous Catheter Insertion Procedure Note  Giles Currie  [id number]  04/02/1946  Date:07/09/2020  Time:12:58 AM    Provider Performing: Judithe Modest    Procedure: Insertion of Arterial Line (89169) with US guidance (45038)   Indication(s) Blood pressure monitoring and/or need for frequent ABGs  Consent Unable to obtain consent due to emergent nature of procedure.  Anesthesia None   Time Out Verified patient identification, verified procedure, site/side was marked, verified correct patient position, special equipment/implants available, medications/allergies/relevant history reviewed, required imaging and test results available.   Sterile Technique Maximal sterile technique including full sterile barrier drape, hand hygiene, sterile gown, sterile gloves, mask, hair covering, sterile ultrasound probe cover (if used).   Procedure Description Area of catheter insertion was cleaned with chlorhexidine and draped in sterile fashion. With real-time ultrasound guidance an arterial catheter was placed into the left femoral artery.  Appropriate arterial tracings confirmed on monitor.     Complications/Tolerance None; patient tolerated the procedure well.   EBL Minimal   Specimen(s) None  BIOPATCH applied to the insertion site.    [name], AGACNP-BC [place] [facility] Pager: [phone]

## 2020-07-13 NOTE — Progress Notes (Addendum)
Ch arrived at room in response to PG. Ch provided support to family until they decided to halt resuscitative measures, and when Pt passed. Ch let family know to ask for Ch if further support is needed.
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
## 2020-07-13 DEATH — deceased
